# Patient Record
Sex: Male | Born: 1955 | Race: White | Hispanic: No | Marital: Married | Smoking: Never smoker
Health system: Southern US, Community
[De-identification: ages and names within clinical notes are randomized; demographics above are authoritative.]

## PROBLEM LIST (undated history)

## (undated) DIAGNOSIS — I10 Essential (primary) hypertension: Secondary | ICD-10-CM

## (undated) DIAGNOSIS — Q231 Congenital insufficiency of aortic valve: Secondary | ICD-10-CM

## (undated) DIAGNOSIS — Q2381 Bicuspid aortic valve: Secondary | ICD-10-CM

## (undated) DIAGNOSIS — J180 Bronchopneumonia, unspecified organism: Secondary | ICD-10-CM

## (undated) DIAGNOSIS — E785 Hyperlipidemia, unspecified: Secondary | ICD-10-CM

## (undated) DIAGNOSIS — I351 Nonrheumatic aortic (valve) insufficiency: Secondary | ICD-10-CM

## (undated) DIAGNOSIS — D1803 Hemangioma of intra-abdominal structures: Secondary | ICD-10-CM

## (undated) DIAGNOSIS — I2510 Atherosclerotic heart disease of native coronary artery without angina pectoris: Secondary | ICD-10-CM

## (undated) DIAGNOSIS — N281 Cyst of kidney, acquired: Secondary | ICD-10-CM

## (undated) HISTORY — DX: Congenital insufficiency of aortic valve: Q23.1

## (undated) HISTORY — DX: Atherosclerotic heart disease of native coronary artery without angina pectoris: I25.10

## (undated) HISTORY — DX: Bicuspid aortic valve: Q23.81

## (undated) HISTORY — DX: Essential (primary) hypertension: I10

## (undated) HISTORY — DX: Cyst of kidney, acquired: N28.1

## (undated) HISTORY — DX: Nonrheumatic aortic (valve) insufficiency: I35.1

## (undated) HISTORY — DX: Hyperlipidemia, unspecified: E78.5

## (undated) HISTORY — PX: SUPERFICIAL LYMPH NODE BIOPSY / EXCISION: SUR127

## (undated) HISTORY — DX: Hemangioma of intra-abdominal structures: D18.03

## (undated) HISTORY — DX: Bronchopneumonia, unspecified organism: J18.0

---

## 1998-02-04 ENCOUNTER — Encounter: Admission: RE | Admit: 1998-02-04 | Discharge: 1998-02-04 | Payer: Self-pay | Admitting: *Deleted

## 1998-02-17 ENCOUNTER — Encounter: Admission: RE | Admit: 1998-02-17 | Discharge: 1998-02-17 | Payer: Self-pay | Admitting: *Deleted

## 1998-02-24 ENCOUNTER — Encounter: Admission: RE | Admit: 1998-02-24 | Discharge: 1998-02-24 | Payer: Self-pay | Admitting: *Deleted

## 2010-09-29 ENCOUNTER — Encounter: Payer: Self-pay | Admitting: *Deleted

## 2010-09-29 ENCOUNTER — Ambulatory Visit (INDEPENDENT_AMBULATORY_CARE_PROVIDER_SITE_OTHER): Payer: BC Managed Care – PPO | Admitting: Cardiovascular Disease

## 2010-09-29 ENCOUNTER — Encounter: Payer: Self-pay | Admitting: Cardiovascular Disease

## 2010-09-29 DIAGNOSIS — I359 Nonrheumatic aortic valve disorder, unspecified: Secondary | ICD-10-CM

## 2010-09-29 DIAGNOSIS — R011 Cardiac murmur, unspecified: Secondary | ICD-10-CM

## 2010-09-29 DIAGNOSIS — I351 Nonrheumatic aortic (valve) insufficiency: Secondary | ICD-10-CM

## 2010-09-29 DIAGNOSIS — I38 Endocarditis, valve unspecified: Secondary | ICD-10-CM

## 2010-09-29 NOTE — Assessment & Plan Note (Signed)
The patient has a very loud cardiac murmur which seems to be suggestive of an aortic etiology. The patient had no previous reported heart murmur. He is not aware of any previous history of bicuspid aortic valve. This is very concerning for possible subacute endocarditis due to his recent upper respiratory tract infection as well as dental work about 2 months ago. Surprisingly, the patient is really not having any cardiac symptoms. He is not having any signs of infection as well and remains afebrile. I sent the patient to have stat labs done which showed a white cell count of 7000, hemoglobin is 13.8 and a platelet count of 255. His CMP is normal. Sedimentation rate was only 7. I arranged for him to have a stat echocardiogram at [facility]. I reviewed the images. The left ventricle is mildly dilated at 5.5 cm. LV systolic function is borderline reduced with an ejection fraction of 50%. The aortic valve appears to be thickened but not well visualized. A bicuspid aortic valve could not be ruled out. 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 A small vegetation could not be ruled out. Based on these findings, I advised the patient to be admitted to the hospital to obtain blood cultures and start empiric antibiotics. The patient did not want to be admitted as he actually is not having any symptoms and also once to inform his wife. I will arrange for him to have a transesophageal echocardiogram tomorrow at [facility]. If it shows a definitive vegetation, he will need to be admitted to the hospital.

## 2010-09-29 NOTE — Progress Notes (Signed)
HPI  This is a 55 year old male who is referred urgently by Dr. Marina Goodell due to the loud murmur that was detected today. The patient has no previous cardiac history. He has no previous heart murmur or rheumatic fever. He has no chronic medical conditions. There is no history of hypertension, hyperlipidemia or diabetes. He does not have any prior history of endocarditis. This is a healthy gentleman who had a cold about one month ago which was not unusual for him. He did not have any fevers or chills at that time. He did not take any antibiotics and the condition resolved on its own after about a week. 2 weeks ago, he started hearing noise in his chest and hearing the blood flow. He hears gurgling noise. The patient otherwise is feeling very well . He denies any chest pain, dyspnea, orthopnea or PND. There is no lower extremity edema. He is very active and continues to exercise regularly. He bikes and runs on a regular basis. He ran 1and 1/2 miles yesterday without any exertional symptoms. He denies any dizziness, syncope or presyncope. He did have a root canal done 2 months ago but did not have any fevers or chills around that time. He is not aware of any congenital heart disease. 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   No Known Allergies   No current outpatient prescriptions on file prior to visit.     History reviewed. No pertinent past medical history.   Past Surgical History  Procedure Date  . Superficial lymph node biopsy / excision     under arm     Family History  Problem Relation Age of Onset  . Heart disease    . Cancer    . Heart attack    . Arrhythmia    . Hypertension    . Hyperlipidemia       History   Social History  . Marital Status: Married    Spouse Name: N/A    Number of Children: 7  . 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 Smoking status: Never Smoker   . Smokeless  tobacco: Never Used  . Alcohol Use: No  . Drug Use: Yes    Special: Marijuana     last used over 20 years ago  . Sexually Active: Not on file   Other Topics Concern  . Not on file   Social History Narrative  . No narrative on file     ROS Constitutional: Negative for fever, chills, diaphoresis, activity change, appetite change and fatigue.  HENT: Negative for hearing loss, nosebleeds, congestion, sore throat, facial swelling, drooling, trouble swallowing, neck pain, voice change, sinus pressure and tinnitus.  Eyes: Negative for photophobia, pain, discharge and visual disturbance.  Respiratory: Negative for apnea, cough, chest tightness, shortness of breath and wheezing.  Cardiovascular: Negative for chest pain, palpitations and leg swelling.  Gastrointestinal: Negative for nausea, vomiting, abdominal pain, diarrhea, constipation, blood in stool and abdominal distention.  Genitourinary: Negative for dysuria, urgency, frequency, hematuria and decreased urine volume.  Musculoskeletal: Negative for myalgias, back pain, joint swelling, arthralgias and gait problem.  Skin: Negative for color change, pallor, rash and wound.  Neurological: Negative for dizziness, tremors, seizures, syncope, speech difficulty, weakness, light-headedness, numbness and headaches.  Psychiatric/Behavioral: Negative for suicidal ideas, hallucinations, behavioral problems and agitation. The patient is not nervous/anxious.     PHYSICAL EXAM   BP  146/72  Pulse 81  Temp 98.4 F (36.9 C)  Ht 5\' 7"  (1.702 m)  Wt 196 lb (88.905 kg)  BMI 30.70 kg/m2  SpO2 97%  Constitutional: He is oriented to person, place, and time. He appears well-developed and well-nourished. No distress.  HENT: No nasal discharge.  Head: Normocephalic and atraumatic.  Eyes: Pupils are equal, round, and reactive to light. Right eye exhibits no discharge. Left eye exhibits no discharge.  Neck: Normal range of motion. Neck supple. Mild JVD  present. No thyromegaly present.  Cardiovascular: Normal rate, regular rhythm,and intact distal pulses. Exam reveals no gallop and no friction rub.  There is a very loud 5/6 blowing systolic murmur mostly at the aortic and the left sternal border. There is a 1/6 diastolic murmur at the left sternal border. S2 is diminished. Pulmonary/Chest: Effort normal and breath sounds normal. No stridor. No respiratory distress. He has no wheezes. He has no rales. He exhibits no tenderness.  Abdominal: Soft. Bowel sounds are normal. He exhibits no distension. There is no tenderness. There is no rebound and no guarding.  Musculoskeletal: Normal range of motion. He exhibits no edema and no tenderness.  Neurological: He is alert and oriented to person, place, and time. Coordination normal.  Skin: Skin is warm and dry. No rash noted. He is not diaphoretic. No erythema. No pallor.  Psychiatric: He has a normal mood and affect. His behavior is normal. Judgment and thought content normal.       EKG: His ECG was reviewed. It showed normal sinus rhythm with a ventricular rate of 72 beats per minute. Nonspecific ST changes. Normal PR and QT intervals.   ASSESSMENT AND PLAN

## 2010-09-30 ENCOUNTER — Encounter: Payer: Self-pay | Admitting: *Deleted

## 2010-09-30 ENCOUNTER — Ambulatory Visit (HOSPITAL_COMMUNITY)
Admission: RE | Admit: 2010-09-30 | Discharge: 2010-09-30 | Disposition: A | Discharge status: Home / Self Care | Payer: BC Managed Care – PPO | Source: Ambulatory Visit | Attending: Internal Medicine | Admitting: Internal Medicine

## 2010-09-30 DIAGNOSIS — Q231 Congenital insufficiency of aortic valve: Secondary | ICD-10-CM | POA: Insufficient documentation

## 2010-09-30 DIAGNOSIS — I10 Essential (primary) hypertension: Secondary | ICD-10-CM | POA: Insufficient documentation

## 2010-09-30 DIAGNOSIS — I359 Nonrheumatic aortic valve disorder, unspecified: Secondary | ICD-10-CM

## 2010-10-02 HISTORY — PX: AORTIC VALVE SURGERY: SHX549

## 2010-10-02 HISTORY — PX: CARDIAC CATHETERIZATION: SHX172

## 2010-10-03 ENCOUNTER — Ambulatory Visit (INDEPENDENT_AMBULATORY_CARE_PROVIDER_SITE_OTHER): Payer: BC Managed Care – PPO | Admitting: Cardiovascular Disease

## 2010-10-03 ENCOUNTER — Encounter: Payer: Self-pay | Admitting: Cardiovascular Disease

## 2010-10-03 VITALS — BP 144/75 | HR 85 | Ht 67.0 in | Wt 195.0 lb

## 2010-10-03 DIAGNOSIS — I351 Nonrheumatic aortic (valve) insufficiency: Secondary | ICD-10-CM

## 2010-10-03 DIAGNOSIS — I359 Nonrheumatic aortic valve disorder, unspecified: Secondary | ICD-10-CM

## 2010-10-03 NOTE — Assessment & Plan Note (Signed)
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 There was no evidence of aortic aneurysm or ectasia. Surprisingly, the murmur seems to be not as loud as it was last week The  cardiac murmur only became obvious recently which is likely due to the new onset of prolapsing leaflet. Although there is no indication for aortic valve surgery at this time, the future course is somewhat unpredictable in this condition. The other issue is that the patient feels strongly about not taking warfarin in the future due to previous experience with his mother who had multiple complications from anticoagulation after her valve surgery. Due to all of that, I will refer the patient to Dr. Silvestre Mesi at [facility] for a second opinion and to see if the valve is repairable.  In the meanwhile, I asked the patient to resume his regular activities but avoid any weight lifting and intense aerobic exercises. He will require right and left cardiac catheterization before any planned valvular surgery.

## 2010-10-03 NOTE — Progress Notes (Signed)
HPI  This is a 55 year old male who is here today for a followup visit after recent transesophageal echocardiogram. The patient was seen urgently last week on Thursday after detection of a loud heart murmur by Dr. Marina Goodell. The patient had no cardiac symptoms. He did have a recent upper respiratory tract infection about 4 weeks before his presentation. His chief complaint was actually hearing a noise in his chest. The patient is very active physically and exercises on a regular basis. He runs 2 miles a day on most days of the week. 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 He is married and has 7 children. He lives in [place]. During my evaluation last week, I was impressed with the intensity of the murmur which was 5/6 in the aortic and the left sternal border. It had a systolic and diastolic component and was concerning for an aortic etiology. I was especially concerned about possibility of subacute endocarditis and thus I sent him to have stat labs which showed normal white cell count and sedimentation rate. He had an urgent echocardiogram done which showed an ejection fraction of 55%, borderline dilated left ventricle with a left ventricular end-diastolic dimension of 55 cm. There was possible bicuspid aortic valve with moderate to severe aortic insufficiency. The valve was not well visualized and a vegetation could not be excluded. Based on that, I advised him to get admitted to the hospital but he declined because he was feeling well. Thus, I have arranged for him to have a transesophageal echocardiogram on Friday which confirmed the presence of bicuspid aortic valve, no evidence of vegetation as well as a prolapse of the right coronary cusp with a very eccentric aortic insufficiency towards the mitral valve leaflet. The severity of regurgitation was at least moderate but likely underestimated due to to the eccentric jet. The ejection fraction was normal. The patient's overall is feeling  reasonably well at this time. He is definitely stressed about all this new findings. 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   No Known Allergies   No current outpatient prescriptions on file prior to visit.     Past Medical History  Diagnosis Date  . Aortic insufficiency     moderate to severe  . Bicuspid aortic valve      Past Surgical History  Procedure Date  . Superficial lymph node biopsy / excision     under arm     Family History  Problem Relation Age of Onset  . Heart disease    . Cancer    . Heart attack    . Arrhythmia    . Hypertension    . Hyperlipidemia       History   Social History  . Marital Status: Married    Spouse Name: N/A    Number of Children: 7  . 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 Smoking status: Never Smoker   . Smokeless tobacco: Never Used  . Alcohol Use: No  . Drug Use: Yes    Special: Marijuana     last used over 20 years ago  . Sexually Active: Not on file   Other Topics Concern  . Not on file   Social History Narrative  . No narrative on file      PHYSICAL EXAM  BP 144/75  Pulse 85  Ht 5\' 7"  (1.702 m)  Wt 195 lb (88.451 kg)  BMI 30.54 kg/m2  SpO2 97%  Constitutional: He is oriented to person, place, and time. He appears well-developed and well-nourished. No distress.  HENT: No nasal discharge.  Head: Normocephalic and atraumatic.  Eyes: Pupils are equal, round, and reactive to light. Right eye exhibits no discharge. Left eye exhibits no discharge.  Neck: Normal range of motion. Neck supple. No JVD present. No thyromegaly present.  Cardiovascular: Normal rate, regular rhythm, normal heart sounds and intact distal pulses. Exam reveals no gallop and no friction rub.  There is a 1/6 systolic ejection murmur at the aortic area as well as 3/6 decrescendo diastolic  murmur at the left sternal border. Pulmonary/Chest: Effort normal and breath sounds normal. No stridor. No respiratory distress. He has no wheezes. He has no rales. He exhibits no tenderness.  Abdominal: Soft. Bowel sounds are normal. He exhibits no distension. There is no tenderness. There is no rebound and no guarding.  Musculoskeletal: Normal range of motion. He exhibits no edema and no tenderness.  Neurological: He is alert and oriented to person, place, and time. Coordination normal.  Skin: Skin is warm and dry. No rash noted. He is not diaphoretic. No erythema. No pallor.  Psychiatric: He has a normal mood and affect. His behavior is normal. Judgment and thought content normal.        ASSESSMENT AND PLAN

## 2010-11-28 ENCOUNTER — Encounter: Payer: Self-pay | Admitting: Cardiovascular Disease

## 2010-11-28 ENCOUNTER — Ambulatory Visit (INDEPENDENT_AMBULATORY_CARE_PROVIDER_SITE_OTHER): Payer: BC Managed Care – PPO | Admitting: Cardiovascular Disease

## 2010-11-28 VITALS — BP 118/77 | HR 73 | Ht 67.0 in | Wt 190.0 lb

## 2010-11-28 DIAGNOSIS — E785 Hyperlipidemia, unspecified: Secondary | ICD-10-CM

## 2010-11-28 DIAGNOSIS — I359 Nonrheumatic aortic valve disorder, unspecified: Secondary | ICD-10-CM

## 2010-11-28 DIAGNOSIS — I351 Nonrheumatic aortic (valve) insufficiency: Secondary | ICD-10-CM

## 2010-11-28 MED ORDER — ATORVASTATIN CALCIUM 20 MG PO TABS: 20.0000 mg | ORAL_TABLET | Freq: Every day | ORAL | Status: DC

## 2010-11-28 MED ORDER — METOPROLOL TARTRATE 25 MG PO TABS: 25.0000 mg | ORAL_TABLET | Freq: Two times a day (BID) | ORAL | Status: DC

## 2010-11-28 NOTE — Assessment & Plan Note (Signed)
He is status post aortic valve repair about 5 weeks ago. He is doing very well. He does not have a diastolic murmur anymore. Even the intensity of the systolic murmur has decreased significantly. I will keep him on aspirin 81 mg once daily. Obviously, he still has a bicuspid aortic valve that will need to be monitored. I recommend a followup baseline echocardiogram in 4 months from now. He will likely need a yearly echocardiogram after that. I also informed him that all his children will need to be examined for heart murmurs and that she should notify their pediatricians of his current diagnosis.

## 2010-11-28 NOTE — Progress Notes (Signed)
HPI  This is a 55 year old male who is here today for followup visit. He has a history of moderate to severe aortic insufficiency in the setting of bicuspid aortic valve. 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 I suggested pursuing the possibility of aortic valve repair instead of replacement. The patient requested to be seen at [facility]. Thus, I referred him to Dr. Silvestre Mesi. The patient was seen there and was scheduled for an outpatient surgery to be done this month. However, he had an episode of dyspnea and dizziness. He was seen at [facility] and then transferred to [facility]. The patient did not have myocardial infarction and was not in heart failure. It was felt that there was some anxiety component. The patient was discharged home and scheduled for aortic valve repair few days after discharge. He underwent cardiac catheterization which showed only a 30% stenosis in the right coronary artery. The patient underwent aortic valve repair without complications. Overall, he feels reasonably well. He denies any chest pain or dyspnea. There still some discomfort at the incision. He is going back to work in a week.  No Known Allergies   No current outpatient prescriptions on file prior to visit.     Past Medical History  Diagnosis Date  . Aortic insufficiency     moderate to severe  . Bicuspid aortic valve   . Coronary artery disease     30% in RCA  . Hyperlipidemia      Past Surgical History  Procedure Date  . Superficial lymph node biopsy / excision     under arm  . Aortic valve surgery 10/2010    valve repair without replacement at [facility].   . Cardiac catheterization 10/2010    30% in RCA     Family History  Problem Relation Age of Onset  . Heart disease    . Cancer    . Heart attack    . Arrhythmia    . Hypertension    . Hyperlipidemia       History   Social History  . Marital Status: Married    Spouse Name: N/A   Number of Children: 7  . 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 Smoking status: Never Smoker   . Smokeless tobacco: Never Used  . Alcohol Use: No  . Drug Use: Yes    Special: Marijuana     last used over 20 years ago  . Sexually Active: Not on file   Other Topics Concern  . Not on file   Social History Narrative  . No narrative on file      PHYSICAL EXAM   BP 118/77  Pulse 73  Ht 5\' 7"  (1.702 m)  Wt 190 lb (86.183 kg)  BMI 29.76 kg/m2  SpO2 97%  Constitutional: He is oriented to person, place, and time. He appears well-developed and well-nourished. No distress.  HENT: No nasal discharge.  Head: Normocephalic and atraumatic.  Eyes: Pupils are equal, round, and reactive to light. Right eye exhibits no discharge. Left eye exhibits no discharge.  Neck: Normal range of motion. Neck supple. No JVD present. No thyromegaly present.  Cardiovascular: Normal rate, regular rhythm, normal heart sounds and intact distal pulses. Exam reveals no gallop and no friction rub.  There is a 1/6 systolic ejection murmur at the aortic area. Pulmonary/Chest: Effort normal and breath sounds normal.  No stridor. No respiratory distress. He has no wheezes. He has no rales. He exhibits no tenderness.  Abdominal: Soft. Bowel sounds are normal. He exhibits no distension. There is no tenderness. There is no rebound and no guarding.  Musculoskeletal: Normal range of motion. He exhibits no edema and no tenderness.  Neurological: He is alert and oriented to person, place, and time. Coordination normal.  Skin: Skin is warm and dry. No rash noted. He is not diaphoretic. No erythema. No pallor.  Psychiatric: He has a normal mood and affect. His behavior is normal. Judgment and thought content normal.        ASSESSMENT AND PLAN

## 2010-11-28 NOTE — Assessment & Plan Note (Signed)
The patient was recently started on atorvastatin 20 mg daily for hyperlipidemia as well as nonobstructive coronary artery disease. I will request a fasting lipid and liver profile. I also talked with him about resuming his aerobic exercises.

## 2010-11-28 NOTE — Patient Instructions (Signed)
Your physician recommends that you schedule a follow-up appointment in: 4 months in [facility]  Your physician recommends that you return for lab work in: this week  .

## 2011-04-10 ENCOUNTER — Telehealth: Payer: Self-pay | Admitting: Cardiovascular Disease

## 2011-04-10 DIAGNOSIS — I359 Nonrheumatic aortic valve disorder, unspecified: Secondary | ICD-10-CM

## 2011-04-10 MED ORDER — AMOXICILLIN 500 MG PO CAPS: ORAL_CAPSULE | ORAL | Status: DC

## 2011-04-10 NOTE — Telephone Encounter (Signed)
Spoke with pt. He had aortic valve repair in July 2012. I told him he would need amoxicillin 2gms by mouth thirty minutes prior to dental work. He has no medication allergies. States he has latex allergy. Will send prescription to [facility] [place].

## 2011-04-10 NOTE — Telephone Encounter (Signed)
Pt having root canal on 04-18-11, does he need abx? If so uses Chief Technology Officer

## 2011-04-21 ENCOUNTER — Encounter: Payer: Self-pay | Admitting: Internal Medicine

## 2011-04-24 ENCOUNTER — Encounter: Payer: Self-pay | Admitting: Cardiovascular Disease

## 2011-04-24 ENCOUNTER — Ambulatory Visit (INDEPENDENT_AMBULATORY_CARE_PROVIDER_SITE_OTHER): Payer: BC Managed Care – PPO | Admitting: Cardiovascular Disease

## 2011-04-24 VITALS — BP 118/76 | HR 64 | Ht 67.0 in | Wt 208.4 lb

## 2011-04-24 DIAGNOSIS — I359 Nonrheumatic aortic valve disorder, unspecified: Secondary | ICD-10-CM

## 2011-04-24 DIAGNOSIS — I351 Nonrheumatic aortic (valve) insufficiency: Secondary | ICD-10-CM

## 2011-04-24 NOTE — Assessment & Plan Note (Signed)
S/p repair only of bicuspid aortic valve for severe AI at [facility]. Doing well. He will need repeat echo to evaluate his bicuspid valve. There is a mild systolic murmur. Will need yearly echos.

## 2011-04-24 NOTE — Patient Instructions (Signed)
Your physician wants you to follow-up in: 12 months.  You will receive a reminder letter in the mail two months in advance. If you don't receive a letter, please call our office to schedule the follow-up appointment.  Your physician has requested that you have an echocardiogram. Echocardiography is a painless test that uses sound waves to create images of your heart. It provides your doctor with information about the size and shape of your heart and how well your heart's chambers and valves are working. This procedure takes approximately one hour. There are no restrictions for this procedure.    Your physician recommends that you continue on your current medications as directed. Please refer to the Current Medication list given to you today.   

## 2011-04-24 NOTE — Progress Notes (Signed)
History of Present Illness: 56 yo WM with history of bicuspid aortic valve s/p aortic valve repair at [facility] 2012. He has been followed by Dr. Kirke Corin in the [facility] office.  He has a history of moderate to severe aortic insufficiency in the setting of bicuspid aortic valve. 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 Dr. Kirke Corin suggested pursuing the possibility of aortic valve repair instead of replacement. The patient requested to be seen at [facility].  He underwent cardiac catheterization which showed only a 30% stenosis in the right coronary artery. The patient underwent aortic valve repair without complications on 09/24/10.   Overall, he feels reasonably well. He denies any chest pain or dyspnea. He does feel fatigued at times. He has been working. 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   Past Medical History  Diagnosis Date  . Aortic insufficiency     moderate to severe  . Bicuspid aortic valve   . Coronary artery disease     30% in RCA  . Hyperlipidemia     Past Surgical History  Procedure Date  . Superficial lymph node biopsy / excision     under arm  . Aortic valve surgery 10/2010    valve repair without replacement at [facility].   . Cardiac catheterization 10/2010    30% in RCA    Current Outpatient Prescriptions  Medication Sig Dispense Refill  . amoxicillin (AMOXIL) 500 MG capsule Take 4 capsules by mouth 30 minutes prior to dental work  4 capsule  0  . aspirin 81 MG tablet Take 81 mg by mouth daily.        Marland Kitchen atorvastatin (LIPITOR) 20 MG tablet Take 1 tablet (20 mg total) by mouth daily.  90 tablet  2  . DHA-EPA-Coenzyme Q10-Vitamin E (CO Q-10 VITAMIN E FISH OIL PO) Take by mouth.      . metoprolol tartrate (LOPRESSOR) 25 MG tablet Take 1 tablet (25 mg total) by mouth 2 (two) times daily.  180 tablet  2  . Multiple Vitamin (MULTIVITAMIN) tablet Take 1 tablet by mouth daily.      . vitamin B-12 (CYANOCOBALAMIN) 100 MCG tablet  Take 250 mcg by mouth daily.        Allergies  Allergen Reactions  . Latex     History   Social History  . Marital Status: Married    Spouse Name: N/A    Number of Children: 7  . 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 Smoking status: Never Smoker   . Smokeless tobacco: Never Used  . Alcohol Use: No  . Drug Use: Yes    Special: Marijuana     last used over 20 years ago  . Sexually Active: Not on file   Other Topics Concern  . Not on file   Social History Narrative  . No narrative on file    Family History  Problem Relation Age of Onset  . Heart disease    . Cancer    . Heart attack    . Arrhythmia    . Hypertension    . Hyperlipidemia      Review of Systems:  As stated in the HPI and otherwise negative.   BP 118/76  Pulse 64  Ht 5\' 7"  (1.702 m)  Wt 208 lb 6.4 oz (94.53 kg)  BMI 32.64 kg/m2  Physical Examination: General:  Well developed, well nourished, NAD HEENT: OP clear, mucus membranes moist SKIN: warm, dry. No rashes. Neuro: No focal deficits Musculoskeletal: Muscle strength 5/5 all ext Psychiatric: Mood and affect normal Neck: No JVD, no carotid bruits, no thyromegaly, no lymphadenopathy. Lungs:Clear bilaterally, no wheezes, rhonci, crackles Cardiovascular: Regular rate and rhythm. Slight systolic  Murmur. No  gallops or rubs. Abdomen:Soft. Bowel sounds present. Non-tender.  Extremities: No lower extremity edema. Pulses are 2 + in the bilateral DP/PT.

## 2011-05-01 ENCOUNTER — Ambulatory Visit (HOSPITAL_COMMUNITY): Payer: BC Managed Care – PPO | Attending: Cardiology | Admitting: Radiology

## 2011-05-01 DIAGNOSIS — I2510 Atherosclerotic heart disease of native coronary artery without angina pectoris: Secondary | ICD-10-CM | POA: Insufficient documentation

## 2011-05-01 DIAGNOSIS — E785 Hyperlipidemia, unspecified: Secondary | ICD-10-CM | POA: Insufficient documentation

## 2011-05-01 DIAGNOSIS — Q231 Congenital insufficiency of aortic valve: Secondary | ICD-10-CM | POA: Insufficient documentation

## 2011-05-01 DIAGNOSIS — I351 Nonrheumatic aortic (valve) insufficiency: Secondary | ICD-10-CM

## 2011-05-01 DIAGNOSIS — I359 Nonrheumatic aortic valve disorder, unspecified: Secondary | ICD-10-CM

## 2011-08-25 ENCOUNTER — Telehealth: Payer: Self-pay | Admitting: Cardiovascular Disease

## 2011-08-25 MED ORDER — METOPROLOL TARTRATE 25 MG PO TABS: 25.0000 mg | ORAL_TABLET | Freq: Two times a day (BID) | ORAL | Status: DC

## 2011-08-25 NOTE — Telephone Encounter (Signed)
Spoke to patient's wife stated patient needs refill for metoprolol sent to [facility] in [place].

## 2011-08-25 NOTE — Telephone Encounter (Signed)
metprolol 25 mg , [facility] [place] , said pharmacy requested twice no response, pt now out, pls call

## 2011-09-14 ENCOUNTER — Other Ambulatory Visit: Payer: Self-pay | Admitting: Cardiovascular Disease

## 2011-09-14 ENCOUNTER — Other Ambulatory Visit (HOSPITAL_COMMUNITY): Payer: Self-pay | Admitting: *Deleted

## 2011-09-14 MED ORDER — ATORVASTATIN CALCIUM 20 MG PO TABS: 20.0000 mg | ORAL_TABLET | Freq: Every day | ORAL | Status: DC

## 2011-09-14 NOTE — Telephone Encounter (Signed)
Refilled atorvastatin

## 2012-05-07 ENCOUNTER — Ambulatory Visit: Payer: BC Managed Care – PPO | Admitting: Cardiovascular Disease

## 2012-05-08 ENCOUNTER — Encounter: Payer: Self-pay | Admitting: Cardiovascular Disease

## 2012-05-08 ENCOUNTER — Ambulatory Visit (INDEPENDENT_AMBULATORY_CARE_PROVIDER_SITE_OTHER): Payer: BC Managed Care – PPO | Admitting: Cardiovascular Disease

## 2012-05-08 VITALS — BP 150/84 | HR 83 | Ht 67.0 in | Wt 214.0 lb

## 2012-05-08 DIAGNOSIS — R002 Palpitations: Secondary | ICD-10-CM

## 2012-05-08 DIAGNOSIS — I2510 Atherosclerotic heart disease of native coronary artery without angina pectoris: Secondary | ICD-10-CM

## 2012-05-08 DIAGNOSIS — Q231 Congenital insufficiency of aortic valve: Secondary | ICD-10-CM

## 2012-05-08 DIAGNOSIS — Q2381 Bicuspid aortic valve: Secondary | ICD-10-CM

## 2012-05-08 LAB — BASIC METABOLIC PANEL
Chloride: 104 mEq/L (ref 96–112)
Creatinine, Ser: 0.9 mg/dL (ref 0.4–1.5)
GFR: 94.23 mL/min (ref >60.00)
Potassium: 3.5 mEq/L (ref 3.5–5.1)
Sodium: 139 mEq/L (ref 135–145)

## 2012-05-08 MED ORDER — FUROSEMIDE 20 MG PO TABS: 20.0000 mg | ORAL_TABLET | Freq: Every day | ORAL | Status: DC

## 2012-05-08 NOTE — Patient Instructions (Addendum)
Your physician recommends that you schedule a follow-up appointment in: 2-3 weeks.   Your physician has requested that you have an echocardiogram. Echocardiography is a painless test that uses sound waves to create images of your heart. It provides your doctor with information about the size and shape of your heart and how well your heart's chambers and valves are working. This procedure takes approximately one hour. There are no restrictions for this procedure.  Your physician has recommended that you wear a holter monitor. Holter monitors are medical devices that record the heart's electrical activity. Doctors most often use these monitors to diagnose arrhythmias. Arrhythmias are problems with the speed or rhythm of the heartbeat. The monitor is a small, portable device. You can wear one while you do your normal daily activities. This is usually used to diagnose what is causing palpitations/syncope (passing out).  Your physician has requested that you have an exercise stress myoview. For further information please visit [email or web address] Please follow instruction sheet, as given.  Your physician has recommended you make the following change in your medication:  Start furosemide 20 mg by mouth daily

## 2012-05-08 NOTE — Progress Notes (Signed)
History of Present Illness: 57 yo WM with history of bicuspid aortic valve s/p aortic valve repair at [facility] 2012.  He has a history of moderate to severe aortic insufficiency in the setting of bicuspid aortic valve. 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 Dr. Kirke Corin suggested pursuing the possibility of aortic valve repair instead of replacement. The patient requested to be seen at [facility]. He underwent cardiac catheterization which showed only a 30% stenosis in the right coronary artery. The patient underwent aortic valve repair without complications on 09/24/10.   He is here today for follow up. He tells me that he has been having constant chest pressure for the last 3-4 months.No worsening with exertion but this is a tightness. He feels that he can't catch his breath and has constant congestion. He feels terrible.  He also notes palpitations occurring at night.  No prolonged episodes.   Primary Care Physician: Brent Bulla   Past Medical History  Diagnosis Date  . Aortic insufficiency     moderate to severe  . Bicuspid aortic valve   . Coronary artery disease     30% in RCA  . Hyperlipidemia     Past Surgical History  Procedure Date  . Superficial lymph node biopsy / excision     under arm  . Aortic valve surgery 10/2010    valve repair without replacement at [facility].   . Cardiac catheterization 10/2010    30% in RCA    Current Outpatient Prescriptions  Medication Sig Dispense Refill  . amoxicillin (AMOXIL) 500 MG capsule Take 4 capsules by mouth 30 minutes prior to dental work  4 capsule  0  . aspirin 81 MG tablet Take 81 mg by mouth daily.        Marland Kitchen atorvastatin (LIPITOR) 20 MG tablet Take 1 tablet (20 mg total) by mouth daily.  90 tablet  3  . DHA-EPA-Coenzyme Q10-Vitamin E (CO Q-10 VITAMIN E FISH OIL PO) Take by mouth.      . metoprolol tartrate (LOPRESSOR) 25 MG tablet Take 1 tablet (25 mg total) by mouth 2 (two) times daily.  180  tablet  3  . Multiple Vitamin (MULTIVITAMIN) tablet Take 1 tablet by mouth daily.      . vitamin B-12 (CYANOCOBALAMIN) 100 MCG tablet Take 250 mcg by mouth daily.        Allergies  Allergen Reactions  . Latex     History   Social History  . Marital Status: Married    Spouse Name: N/A    Number of Children: 7  . 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 Smoking status: Never Smoker   . Smokeless tobacco: Never Used  . Alcohol Use: No  . Drug Use: Yes    Special: Marijuana     Comment: last used over 20 years ago  . Sexually Active: Not on file   Other Topics Concern  . Not on file   Social History Narrative  . No narrative on file    Family History  Problem Relation Age of Onset  . Hypertension    . Hyperlipidemia    . Heart attack Mother   . Cancer Father     Brain tumor  . Cancer Mother     Breast cancer    Review of Systems:  As stated in the HPI and otherwise negative.  BP 150/84  Pulse 83  Ht 5\' 7"  (1.702 m)  Wt 214 lb (97.07 kg)  BMI 33.52 kg/m2  Physical Examination: General: Well developed, well nourished, NAD HEENT: OP clear, mucus membranes moist SKIN: warm, dry. No rashes. Neuro: No focal deficits Musculoskeletal: Muscle strength 5/5 all ext Psychiatric: Mood and affect normal Neck: + JVD, no carotid bruits, no thyromegaly, no lymphadenopathy. Lungs:Clear bilaterally, no wheezes, rhonci, crackles Cardiovascular: Regular rate and rhythm. Systolic murmur, diastolic murmur. +S3.  Abdomen:Soft. Bowel sounds present. Non-tender.  Extremities: No lower extremity edema. Pulses are 2 + in the bilateral DP/PT.  EKG: NSR, rate 77 bpm. Non-specific ST and T wave abnormality.   Echo 05/01/11: Left ventricle: The cavity size was normal. Wall thickness was normal. The estimated ejection fraction was 60%. Wall motion was normal; there were no regional wall motion abnormalities. - Aortic  valve: By history, this is a bicuspid valve. Surgery was done to repair prolapse of one cusp of the valve. Currently there is some doming of the valve. There is mid AS and very mild AI. Mean gradient: 16mm Hg (S). Peak gradient: 27mm Hg (S). - Aorta: The ascending aorta is not dilated.  Assessment and Plan:   1. Aortic insufficiency: S/p repair only of bicuspid aortic valve for severe AI at [facility] in 2012. He is not doing well. He will need repeat echo to evaluate his bicuspid valve as both his diastolic and systolic murmurs are louder. He may have mild heart failure.   2. Chest pain/CAD: Will arrange exercise stress myoview to exclude ischemia  3. Palpitations: Will arrange 48 hour monitor to exclude arrythmias  4. Volume overload: +JVD, S3. Will add Lasix 20 mg po once daily. Check BMET.

## 2012-05-14 ENCOUNTER — Ambulatory Visit (HOSPITAL_COMMUNITY): Payer: BC Managed Care – PPO | Attending: Cardiology | Admitting: Radiology

## 2012-05-14 VITALS — BP 136/84 | HR 72 | Ht 67.0 in | Wt 208.0 lb

## 2012-05-14 DIAGNOSIS — R0789 Other chest pain: Secondary | ICD-10-CM | POA: Insufficient documentation

## 2012-05-14 DIAGNOSIS — R0989 Other specified symptoms and signs involving the circulatory and respiratory systems: Secondary | ICD-10-CM | POA: Insufficient documentation

## 2012-05-14 DIAGNOSIS — R0609 Other forms of dyspnea: Secondary | ICD-10-CM | POA: Insufficient documentation

## 2012-05-14 DIAGNOSIS — R0602 Shortness of breath: Secondary | ICD-10-CM

## 2012-05-14 DIAGNOSIS — I2510 Atherosclerotic heart disease of native coronary artery without angina pectoris: Secondary | ICD-10-CM | POA: Insufficient documentation

## 2012-05-14 DIAGNOSIS — R002 Palpitations: Secondary | ICD-10-CM | POA: Insufficient documentation

## 2012-05-14 DIAGNOSIS — R079 Chest pain, unspecified: Secondary | ICD-10-CM

## 2012-05-14 DIAGNOSIS — R42 Dizziness and giddiness: Secondary | ICD-10-CM | POA: Insufficient documentation

## 2012-05-14 DIAGNOSIS — I4949 Other premature depolarization: Secondary | ICD-10-CM

## 2012-05-14 DIAGNOSIS — R5383 Other fatigue: Secondary | ICD-10-CM | POA: Insufficient documentation

## 2012-05-14 DIAGNOSIS — R5381 Other malaise: Secondary | ICD-10-CM | POA: Insufficient documentation

## 2012-05-14 MED ORDER — TECHNETIUM TC 99M SESTAMIBI GENERIC - CARDIOLITE
10.0000 | Freq: Once | INTRAVENOUS | Status: AC | PRN
  Administered 2012-05-14: 10 via INTRAVENOUS

## 2012-05-14 MED ORDER — TECHNETIUM TC 99M SESTAMIBI GENERIC - CARDIOLITE
30.0000 | Freq: Once | INTRAVENOUS | Status: AC | PRN
  Administered 2012-05-14: 30 via INTRAVENOUS

## 2012-05-14 NOTE — Progress Notes (Signed)
[facility] 3 NUCLEAR MED [address] [place], [place] [address] [phone]    Cardiology Nuclear Med Study  Gillermo Poch is a 57 y.o. male     MRN : [id number]     DOB: [birth year]  Procedure Date: 05/14/2012  Nuclear Med Background Indication for Stress Test:  Evaluation for Ischemia History:  6/12 Cath:n/o CAD>Bicuspid AVR; 1/13 Echo:EF=60% Cardiac Risk Factors: Family History - CAD, Lipids and Obesity  Symptoms:  Chest Pressure, "constantly".  (chest discomfort now is 1/10), Dizziness, DOE/SOB, Fatigue and Palpitations    Nuclear Pre-Procedure Caffeine/Decaff Intake:  None > 12 hrs NPO After: 10:30pm   Lungs:  Clear. O2 Sat: 98% on room air. IV 0.9% NS with Angio Cath:  22g  IV Site: R Antecubital x 1, tolerated well IV Started by:  Irean Hong, RN  Chest Size (in):  46 Cup Size: n/a  Height: 5\' 7"  (1.702 m)  Weight:  208 lb (94.348 kg)  BMI:  Body mass index is 32.57 kg/(m^2). Tech Comments:  Lopressor held x 16 hours    Nuclear Med Study 1 or 2 day study: 1 day  Stress Test Type:  Stress  Reading MD: Cassell Clement, MD  Order Authorizing Provider:  Verne Carrow, MD  Resting Radionuclide: Technetium 40m Sestamibi  Resting Radionuclide Dose: 11.0 mCi   Stress Radionuclide:  Technetium 82m Sestamibi  Stress Radionuclide Dose: 33.0 mCi           Stress Protocol Rest HR: 72 Stress HR: 169  Rest BP: 136/84 Stress BP: 190/81  Exercise Time (min): 8:30 METS: 10.4   Predicted Max HR: 165 bpm % Max HR: 102.42 bpm Rate Pressure Product: 21308   Dose of Adenosine (mg):  n/a Dose of Lexiscan: n/a mg  Dose of Atropine (mg): n/a Dose of Dobutamine: n/a mcg/kg/min (at max HR)  Stress Test Technologist: Smiley Houseman, CMA-N  Nuclear Technologist:  Domenic Polite, CNMT     Rest Procedure:  Myocardial perfusion imaging was performed at rest 45 minutes following the intravenous administration of Technetium 38m Sestamibi.  Rest ECG: NSR with  non-specific ST-T wave changes  Stress Procedure:  The patient exercised on the treadmill utilizing the Bruce Protocol for 8:30 minutes. The patient stopped due to fatigue and denied any chest pain.  Technetium 32m Sestamibi was injected at peak exercise and myocardial perfusion imaging was performed after a brief delay.  Stress ECG: No significant ST segment change suggestive of ischemia.  QPS Raw Data Images:  Normal; no motion artifact; normal heart/lung ratio. Stress Images:  Normal homogeneous uptake in all areas of the myocardium. Rest Images:  Normal homogeneous uptake in all areas of the myocardium. Subtraction (SDS):  No evidence of ischemia. Transient Ischemic Dilatation (Normal <1.22):  0.91 Lung/Heart Ratio (Normal <0.45):  0.30  Quantitative Gated Spect Images QGS EDV:  110 ml QGS ESV:  50 ml  Impression Exercise Capacity:  Good exercise capacity. BP Response:  Normal blood pressure response. Clinical Symptoms:  No chest pain. ECG Impression:  No significant ST segment change suggestive of ischemia. Comparison with Prior Nuclear Study: No previous nuclear study performed  Overall Impression:  Normal stress nuclear study.  LV Ejection Fraction: 54%.  LV Wall Motion:  NL LV Function; NL Wall Motion   [employer]

## 2012-05-17 ENCOUNTER — Other Ambulatory Visit (HOSPITAL_COMMUNITY): Payer: BC Managed Care – PPO

## 2012-05-23 ENCOUNTER — Ambulatory Visit (HOSPITAL_COMMUNITY): Payer: BC Managed Care – PPO | Attending: Cardiovascular Disease

## 2012-05-23 ENCOUNTER — Ambulatory Visit (INDEPENDENT_AMBULATORY_CARE_PROVIDER_SITE_OTHER): Payer: BC Managed Care – PPO

## 2012-05-23 DIAGNOSIS — I2510 Atherosclerotic heart disease of native coronary artery without angina pectoris: Secondary | ICD-10-CM | POA: Insufficient documentation

## 2012-05-23 DIAGNOSIS — R002 Palpitations: Secondary | ICD-10-CM

## 2012-05-23 DIAGNOSIS — I359 Nonrheumatic aortic valve disorder, unspecified: Secondary | ICD-10-CM | POA: Insufficient documentation

## 2012-05-23 DIAGNOSIS — E785 Hyperlipidemia, unspecified: Secondary | ICD-10-CM | POA: Insufficient documentation

## 2012-05-23 DIAGNOSIS — Q231 Congenital insufficiency of aortic valve: Secondary | ICD-10-CM | POA: Insufficient documentation

## 2012-05-23 DIAGNOSIS — R079 Chest pain, unspecified: Secondary | ICD-10-CM | POA: Insufficient documentation

## 2012-05-23 NOTE — Progress Notes (Signed)
Echocardiogram performed.  

## 2012-05-24 NOTE — Progress Notes (Signed)
Placed a 48 hr monitor and went over instructions on how to use it and when to return it

## 2012-05-29 ENCOUNTER — Encounter: Payer: Self-pay | Admitting: Cardiovascular Disease

## 2012-05-29 ENCOUNTER — Ambulatory Visit (HOSPITAL_COMMUNITY)
Admission: RE | Admit: 2012-05-29 | Discharge: 2012-05-29 | Disposition: A | Discharge status: Home / Self Care | Payer: BC Managed Care – PPO | Source: Ambulatory Visit | Attending: Cardiovascular Disease | Admitting: Cardiovascular Disease

## 2012-05-29 ENCOUNTER — Ambulatory Visit (INDEPENDENT_AMBULATORY_CARE_PROVIDER_SITE_OTHER): Payer: BC Managed Care – PPO | Admitting: Cardiovascular Disease

## 2012-05-29 VITALS — BP 134/70 | HR 70 | Ht 67.0 in | Wt 213.0 lb

## 2012-05-29 DIAGNOSIS — R059 Cough, unspecified: Secondary | ICD-10-CM | POA: Insufficient documentation

## 2012-05-29 DIAGNOSIS — I2510 Atherosclerotic heart disease of native coronary artery without angina pectoris: Secondary | ICD-10-CM

## 2012-05-29 DIAGNOSIS — R002 Palpitations: Secondary | ICD-10-CM

## 2012-05-29 DIAGNOSIS — I359 Nonrheumatic aortic valve disorder, unspecified: Secondary | ICD-10-CM

## 2012-05-29 DIAGNOSIS — R0602 Shortness of breath: Secondary | ICD-10-CM

## 2012-05-29 NOTE — Progress Notes (Signed)
History of Present Illness: 57 yo WM with history of bicuspid aortic valve s/p aortic valve repair at [facility] 2012. He has a history of moderate to severe aortic insufficiency in the setting of bicuspid aortic valve. 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 Dr. Kirke Corin suggested pursuing the possibility of aortic valve repair instead of replacement. The patient requested to be seen at [facility]. He underwent cardiac catheterization which showed only a 30% stenosis in the right coronary artery. The patient underwent aortic valve repair without complications on 09/24/10. I saw him in the office 05/08/12 and he had c/o constant chest pressure for 3-4 months. No worsening with exertion but this is a tightness. He feels that he can't catch his breath and has constant congestion. He feels terrible. He also notes palpitations occurring at night. No prolonged episodes. I arranged an echo, 48 hour holter monitor and exercise stress myoview. Echo showed LVH with normal LV function, mild AI and mild AS but overall well functioning aortic valve s/p repair. Exercise stress myoview on 05/14/12 with good exercise tolerance, no chest pain and no evidence of ischemia with normal LV function noted. 48 hour holter monitor with PACs, NSR.   He is here today for follow up. He continues to have palpitations, cough and SOB. No fever or chills. No chest pains. No near syncope or syncope. He tells me today that he had a positive PPD 21 years ago but normal CXR one year ago per primary care. No lower ext edema.   Primary Care Physician: Brent Bulla   Past Medical History  Diagnosis Date  . Aortic insufficiency     moderate to severe  . Bicuspid aortic valve   . Coronary artery disease     30% in RCA  . Hyperlipidemia     Past Surgical History  Procedure Laterality Date  . Superficial lymph node biopsy / excision      under arm  . Aortic valve surgery  10/2010    valve repair  without replacement at [facility].   . Cardiac catheterization  10/2010    30% in RCA    Current Outpatient Prescriptions  Medication Sig Dispense Refill  . amoxicillin (AMOXIL) 500 MG capsule Take 4 capsules by mouth 30 minutes prior to dental work  4 capsule  0  . aspirin 81 MG tablet Take 81 mg by mouth daily.        Marland Kitchen atorvastatin (LIPITOR) 20 MG tablet Take 1 tablet (20 mg total) by mouth daily.  90 tablet  3  . DHA-EPA-Coenzyme Q10-Vitamin E (CO Q-10 VITAMIN E FISH OIL PO) Take by mouth.      . furosemide (LASIX) 20 MG tablet Take 1 tablet (20 mg total) by mouth daily.  30 tablet  6  . metoprolol tartrate (LOPRESSOR) 25 MG tablet Take 1 tablet (25 mg total) by mouth 2 (two) times daily.  180 tablet  3  . Multiple Vitamin (MULTIVITAMIN) tablet Take 1 tablet by mouth daily.      . vitamin B-12 (CYANOCOBALAMIN) 100 MCG tablet Take 250 mcg by mouth daily.       No current facility-administered medications for this visit.    Allergies  Allergen Reactions  . Latex     History   Social History  . Marital Status: Married    Spouse Name: N/A    Number of Children: 7  . Years of Education: N/A   Occupational History  . 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 Smoking status: Never Smoker   . Smokeless tobacco: Never Used  . Alcohol Use: No  . Drug Use: Yes    Special: Marijuana     Comment: last used over 20 years ago  . Sexually Active: Not on file   Other Topics Concern  . Not on file   Social History Narrative  . No narrative on file    Family History  Problem Relation Age of Onset  . Hypertension    . Hyperlipidemia    . Heart attack Mother   . Cancer Father     Brain tumor  . Cancer Mother     Breast cancer    Review of Systems:  As stated in the HPI and otherwise negative.   BP 134/70  Pulse 70  Ht 5\' 7"  (1.702 m)  Wt 213 lb (96.616 kg)  BMI 33.35 kg/m2  Physical Examination: General: Well developed, well nourished, NAD HEENT: OP clear,  mucus membranes moist SKIN: warm, dry. No rashes. Neuro: No focal deficits Musculoskeletal: Muscle strength 5/5 all ext Psychiatric: Mood and affect normal Neck: No JVD, no carotid bruits, no thyromegaly, no lymphadenopathy. Lungs:Clear bilaterally, no wheezes, rhonci, crackles Cardiovascular: Regular rate and rhythm. No murmurs, gallops or rubs. Abdomen:Soft. Bowel sounds present. Non-tender.  Extremities: No lower extremity edema. Pulses are 2 + in the bilateral DP/PT.  Echo 05/23/12: Left ventricle: Septal hypokinesis The cavity size was normal. Wall thickness was increased in a pattern of moderate LVH. There was focal basal hypertrophy. Systolic function was normal. The estimated ejection fraction was in the range of 55% to 60%. Wall motion was normal; there were no regional wall motion abnormalities. - Aortic valve: Apparently has had AV surgery Valve does not appear to be bicuspid on short axis images. Mild residual AS and AR - Atrial septum: No defect or patent foramen ovale was Identified.  Stress myoview 05/14/12: Stress Procedure: The patient exercised on the treadmill utilizing the Bruce Protocol for 8:30 minutes. The patient stopped due to fatigue and denied any chest pain. Technetium 47m Sestamibi was injected at peak exercise and myocardial perfusion imaging was performed after a brief delay.  Stress ECG: No significant ST segment change suggestive of ischemia.  QPS  Raw Data Images: Normal; no motion artifact; normal heart/lung ratio.  Stress Images: Normal homogeneous uptake in all areas of the myocardium.  Rest Images: Normal homogeneous uptake in all areas of the myocardium.  Subtraction (SDS): No evidence of ischemia.  Transient Ischemic Dilatation (Normal <1.22): 0.91  Lung/Heart Ratio (Normal <0.45): 0.30  Quantitative Gated Spect Images  QGS EDV: 110 ml  QGS ESV: 50 ml  Impression  Exercise Capacity: Good exercise capacity.  BP Response: Normal blood  pressure response.  Clinical Symptoms: No chest pain.  ECG Impression: No significant ST segment change suggestive of ischemia.  Comparison with Prior Nuclear Study: No previous nuclear study performed  Overall Impression: Normal stress nuclear study.  LV Ejection Fraction: 54%. LV Wall Motion: NL LV Function; NL Wall Motion  Assessment and Plan:   1. Aortic valve disease: S/p repair only of bicuspid aortic valve for severe AI at [facility] in 2012. Recent echo 05/23/12 with mild aortic valvular disease (mild AI, mild AS).    2. Chest pain/CAD: He is known to have mild CAD by cath 2012. Stress test without ischemia. No further ischemic workup at this time.   3. Palpitations: PACs on initial Holter strips. Will get  full report then discuss with patient. Consider 21 day monitor if symptoms persist.   4. Acute on chronic diastolic CHF: Volume status is better with low dose Lasix. Will continue Lasix.   5. Dyspnea/Cough: Persistent. No fevers. Will check CXR today.

## 2012-05-29 NOTE — Patient Instructions (Addendum)
Your physician wants you to follow-up in: 3 months--scheduled for Aug 29, 2012 at 4:15  Have chest X-ray done at [facility].  [place] [address]  Go to radiology to have X-ray

## 2012-08-20 ENCOUNTER — Other Ambulatory Visit: Payer: Self-pay | Admitting: Cardiovascular Disease

## 2012-08-29 ENCOUNTER — Ambulatory Visit: Payer: BC Managed Care – PPO | Admitting: Cardiovascular Disease

## 2012-08-30 ENCOUNTER — Other Ambulatory Visit: Payer: Self-pay | Admitting: *Deleted

## 2012-08-30 MED ORDER — METOPROLOL TARTRATE 25 MG PO TABS: ORAL_TABLET | ORAL | Status: DC

## 2012-09-02 ENCOUNTER — Other Ambulatory Visit: Payer: Self-pay | Admitting: *Deleted

## 2012-09-02 MED ORDER — METOPROLOL TARTRATE 25 MG PO TABS: ORAL_TABLET | ORAL | Status: DC

## 2012-09-18 ENCOUNTER — Telehealth: Payer: Self-pay

## 2012-09-19 ENCOUNTER — Other Ambulatory Visit: Payer: Self-pay

## 2012-09-19 MED ORDER — ATORVASTATIN CALCIUM 20 MG PO TABS: 20.0000 mg | ORAL_TABLET | Freq: Every day | ORAL | Status: DC

## 2012-09-19 NOTE — Telephone Encounter (Signed)
Per Shelda Jakes RN will send in refill of medication and put reminder on refill for pt to make a follow up appointment.

## 2013-03-17 ENCOUNTER — Other Ambulatory Visit: Payer: Self-pay | Admitting: Cardiovascular Disease

## 2013-08-26 ENCOUNTER — Other Ambulatory Visit: Payer: Self-pay | Admitting: Cardiovascular Disease

## 2013-09-29 ENCOUNTER — Other Ambulatory Visit: Payer: Self-pay | Admitting: Cardiovascular Disease

## 2013-10-22 ENCOUNTER — Encounter: Payer: Self-pay | Admitting: Cardiovascular Disease

## 2013-10-22 ENCOUNTER — Ambulatory Visit (INDEPENDENT_AMBULATORY_CARE_PROVIDER_SITE_OTHER): Payer: BC Managed Care – PPO | Admitting: Cardiovascular Disease

## 2013-10-22 VITALS — BP 140/90 | HR 76 | Ht 67.0 in | Wt 196.0 lb

## 2013-10-22 DIAGNOSIS — I359 Nonrheumatic aortic valve disorder, unspecified: Secondary | ICD-10-CM

## 2013-10-22 DIAGNOSIS — R002 Palpitations: Secondary | ICD-10-CM

## 2013-10-22 DIAGNOSIS — I2510 Atherosclerotic heart disease of native coronary artery without angina pectoris: Secondary | ICD-10-CM

## 2013-10-22 MED ORDER — METOPROLOL TARTRATE 25 MG PO TABS: ORAL_TABLET | ORAL | Status: DC

## 2013-10-22 NOTE — Progress Notes (Signed)
History of Present Illness: 58 yo WM with history of bicuspid aortic valve s/p aortic valve repair at [facility] 2012 who is here today for follow up. He had a history of moderate to severe aortic insufficiency in the setting of bicuspid aortic valve. 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 Dr. Fletcher Anon suggested pursuing the possibility of aortic valve repair instead of replacement. The patient requested to be seen at [facility]. He underwent cardiac catheterization 2012 which showed only a 30% stenosis in the right coronary artery. The patient underwent aortic valve repair without complications on 3/50/09. I saw him in the office 05/08/12 and he had c/o constant chest pressure for 3-4 months and palpitations at night. I arranged an echo, 48 hour holter monitor and exercise stress myoview. Echo showed LVH with normal LV function, mild AI and mild AS but overall well functioning aortic valve s/p repair. Exercise stress myoview on 05/14/12 with good exercise tolerance, no chest pain and no evidence of ischemia with normal LV function noted. 48 hour holter monitor with PACs, NSR. He has not tolerated statins.   He is here today for follow up. He continues to have daily chest heaviness, central and the same as it has been every day over last three years since his CABG. No near syncope or syncope. Dyspnea is improved. No lower ext edema.   Primary Care Physician: Reinaldo Meeker  Past Medical History  Diagnosis Date  . Aortic insufficiency     moderate to severe  . Bicuspid aortic valve   . Coronary artery disease     30% in RCA  . Hyperlipidemia     Past Surgical History  Procedure Laterality Date  . Superficial lymph node biopsy / excision      under arm  . Aortic valve surgery  10/2010    valve repair without replacement at [facility].   . Cardiac catheterization  10/2010    30% in RCA    Current Outpatient Prescriptions  Medication Sig Dispense Refill  .  aspirin 81 MG tablet Take 81 mg by mouth daily.        . DHA-EPA-Coenzyme Q10-Vitamin E (CO Q-10 VITAMIN E FISH OIL PO) Take by mouth.      . metoprolol tartrate (LOPRESSOR) 25 MG tablet TAKE ONE TABLET BY MOUTH TWICE DAILY  180 tablet  3  . Multiple Vitamin (MULTIVITAMIN) tablet Take 1 tablet by mouth daily.      . vitamin B-12 (CYANOCOBALAMIN) 100 MCG tablet Take 250 mcg by mouth daily.      Marland Kitchen amoxicillin (AMOXIL) 500 MG capsule Take 4 capsules by mouth 30 minutes prior to dental work  4 capsule  0   No current facility-administered medications for this visit.    Allergies  Allergen Reactions  . Latex     History   Social History  . Marital Status: Married    Spouse Name: N/A    Number of Children: 7  . 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 Smoking status: Never Smoker   . Smokeless tobacco: Never Used  . Alcohol Use: No  . Drug Use: Yes    Special: Marijuana     Comment: last used over 20 years ago  . Sexual Activity: Not on file   Other Topics Concern  . Not on file   Social History Narrative  . No narrative  on file    Family History  Problem Relation Age of Onset  . Hypertension    . Hyperlipidemia    . Heart attack Mother   . Cancer Father     Brain tumor  . Cancer Mother     Breast cancer    Review of Systems:  As stated in the HPI and otherwise negative.   BP 140/90  Pulse 76  Ht 5\' 7"  (1.702 m)  Wt 196 lb (88.905 kg)  BMI 30.69 kg/m2  Physical Examination: General: Well developed, well nourished, NAD HEENT: OP clear, mucus membranes moist SKIN: warm, dry. No rashes. Neuro: No focal deficits Musculoskeletal: Muscle strength 5/5 all ext Psychiatric: Mood and affect normal Neck: No JVD, no carotid bruits, no thyromegaly, no lymphadenopathy. Lungs:Clear bilaterally, no wheezes, rhonci, crackles Cardiovascular: Regular rate and rhythm. No murmurs, gallops or rubs. Abdomen:Soft.  Bowel sounds present. Non-tender.  Extremities: No lower extremity edema. Pulses are 2 + in the bilateral DP/PT.  EKG: NSR, rate 76 bpm. Non-specific ST abnormality. (unchanged from February 2015)  Echo 05/23/12: Left ventricle: Septal hypokinesis The cavity size was normal. Wall thickness was increased in a pattern of moderate LVH. There was focal basal hypertrophy. Systolic function was normal. The estimated ejection fraction was in the range of 55% to 60%. Wall motion was normal; there were no regional wall motion abnormalities. - Aortic valve: Apparently has had AV surgery Valve does not appear to be bicuspid on short axis images. Mild residual AS and AR - Atrial septum: No defect or patent foramen ovale was Identified.  Stress myoview 05/14/12: Stress Procedure: The patient exercised on the treadmill utilizing the Bruce Protocol for 8:30 minutes. The patient stopped due to fatigue and denied any chest pain. Technetium 69m Sestamibi was injected at peak exercise and myocardial perfusion imaging was performed after a brief delay.  Stress ECG: No significant ST segment change suggestive of ischemia.  QPS  Raw Data Images: Normal; no motion artifact; normal heart/lung ratio.  Stress Images: Normal homogeneous uptake in all areas of the myocardium.  Rest Images: Normal homogeneous uptake in all areas of the myocardium.  Subtraction (SDS): No evidence of ischemia.  Transient Ischemic Dilatation (Normal <1.22): 0.91  Lung/Heart Ratio (Normal <0.45): 0.30  Quantitative Gated Spect Images  QGS EDV: 110 ml  QGS ESV: 50 ml  Impression  Exercise Capacity: Good exercise capacity.  BP Response: Normal blood pressure response.  Clinical Symptoms: No chest pain.  ECG Impression: No significant ST segment change suggestive of ischemia.  Comparison with Prior Nuclear Study: No previous nuclear study performed  Overall Impression: Normal stress nuclear study.  LV Ejection Fraction: 54%. LV Wall  Motion: NL LV Function; NL Wall Motion  Assessment and Plan:   1. Aortic valve disease: S/p repair only of bicuspid aortic valve for severe AI at [facility] in 2012. Echo 05/23/12 with mild aortic valvular disease (mild AI, mild AS).    2. CAD: He is known to have mild CAD by cath 2012. Stress test February 2015 without ischemia. No further ischemic workup at this time.   3. Palpitations: Likely due to PACs.  Continue beta blocker.

## 2013-10-22 NOTE — Patient Instructions (Signed)
Your physician wants you to follow-up in:  12 months.  You will receive a reminder letter in the mail two months in advance. If you don't receive a letter, please call our office to schedule the follow-up appointment.   

## 2014-01-21 IMAGING — CR DG CHEST 2V
2 series · 2 of 2 positions shown · non-contrast
Comparison: 10/16/2010

CLINICAL DATA: Short of breath.  Cough.

CHEST - 2 VIEW

[w chest pa]
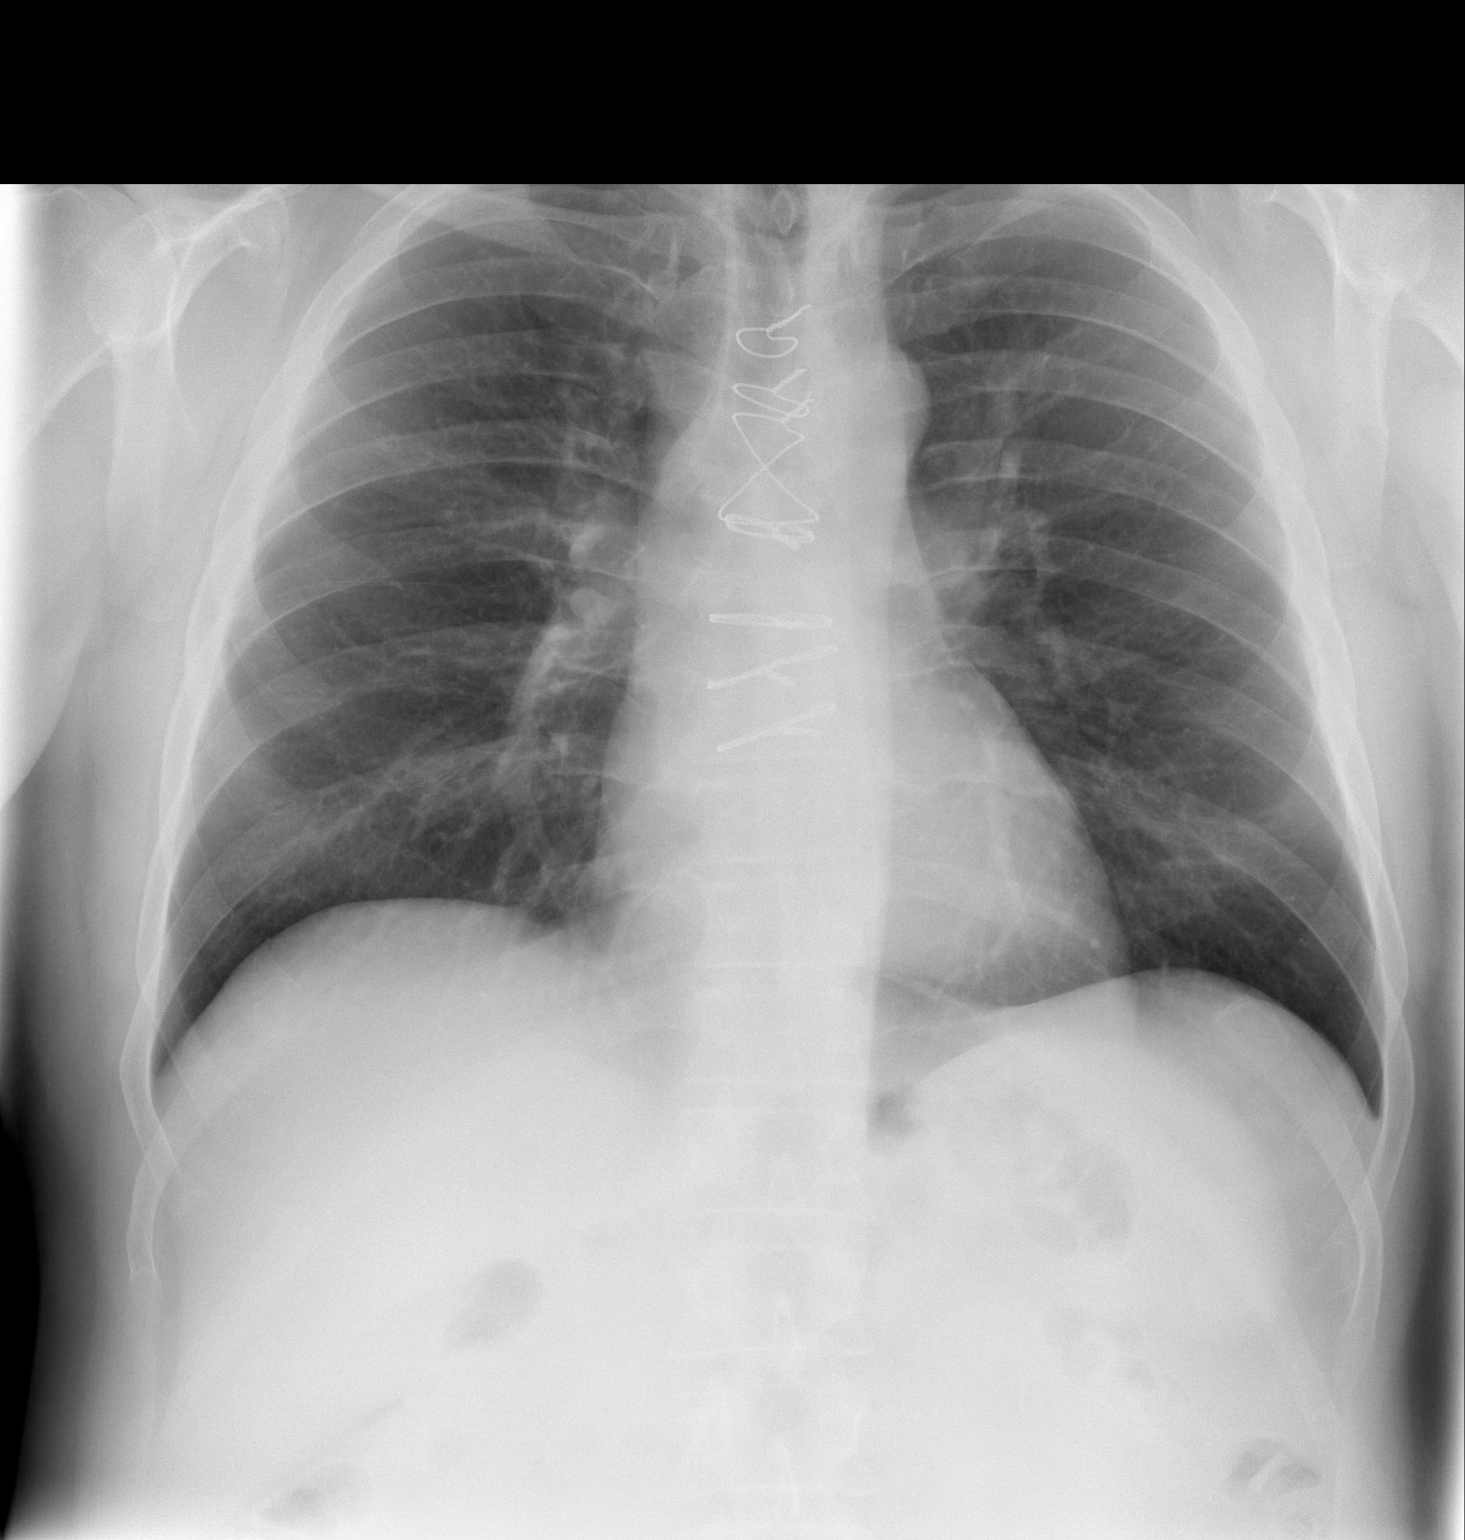

[w chest lat]
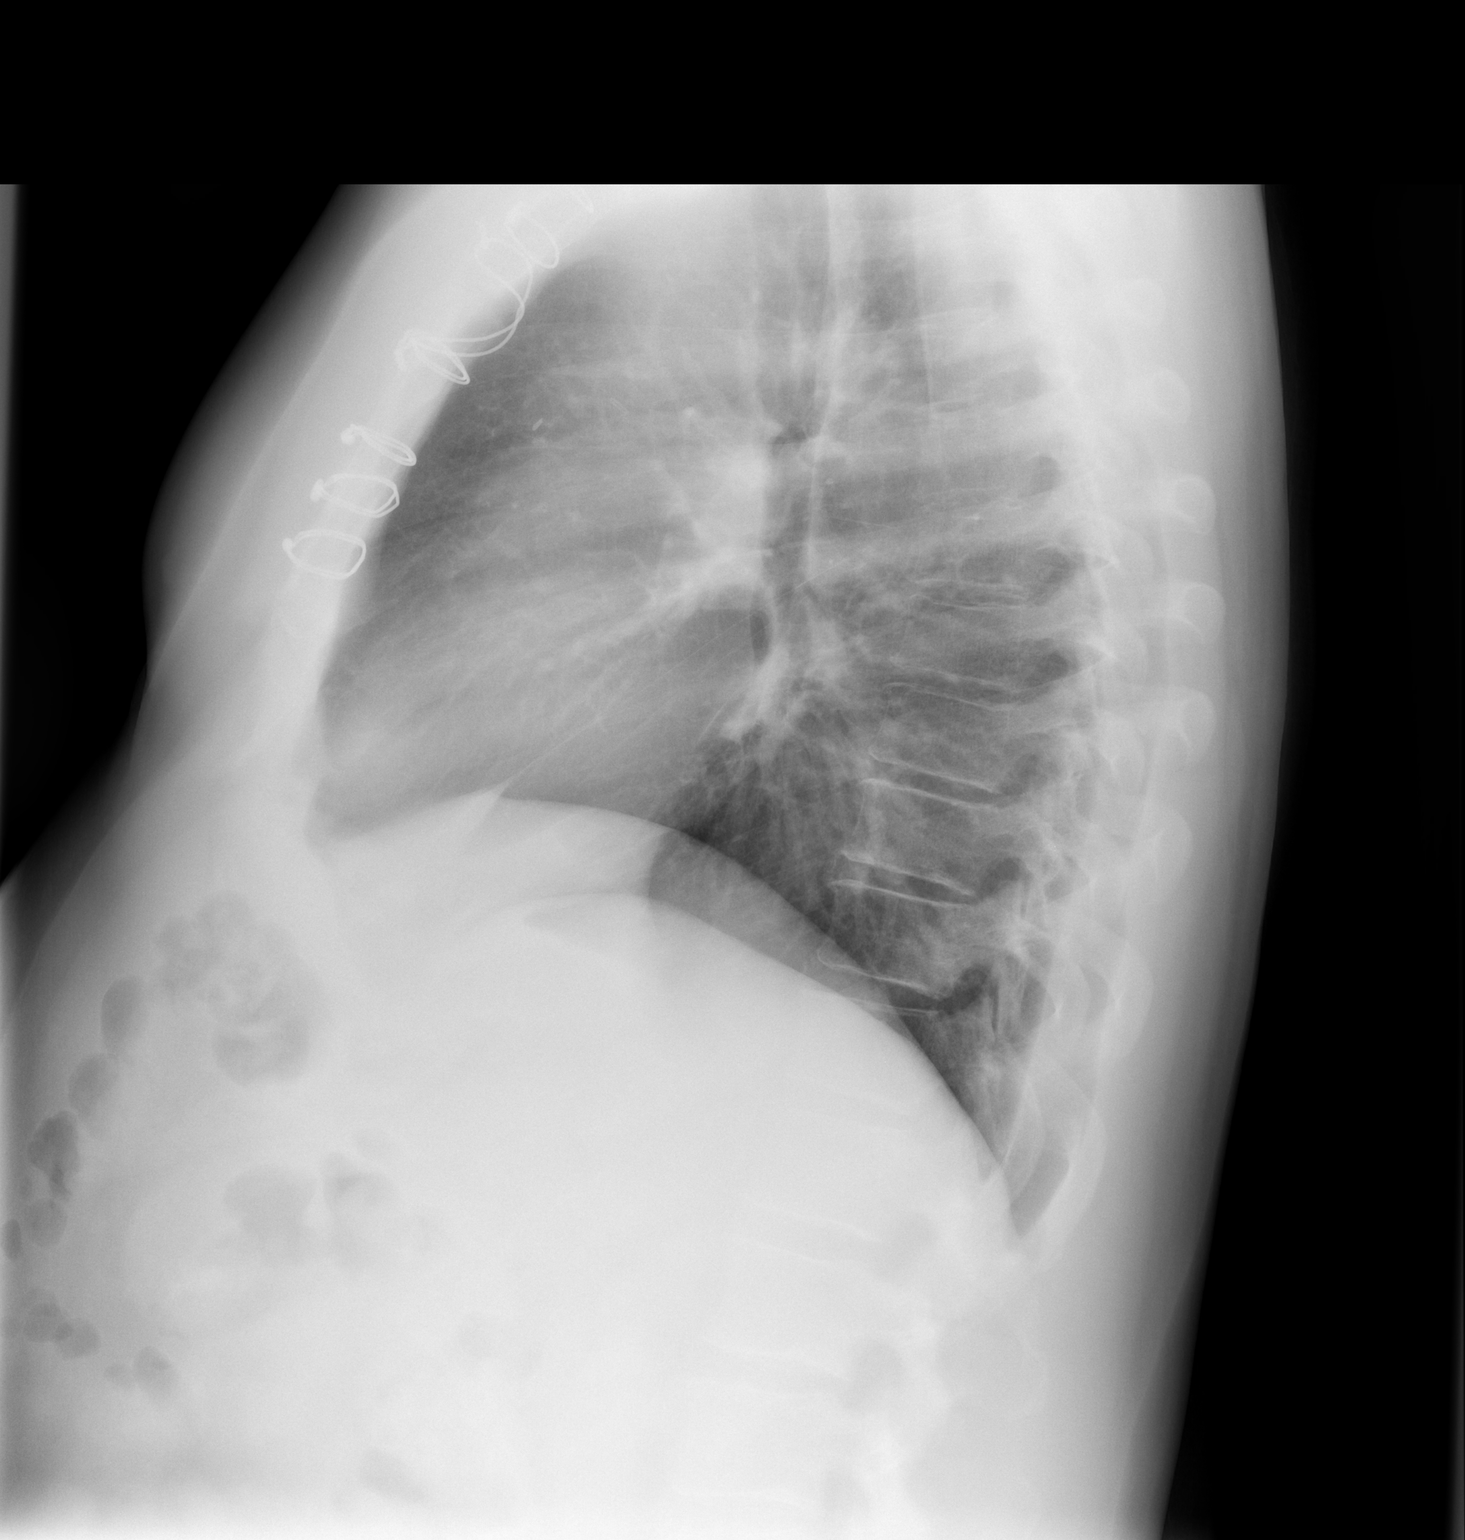

[2 of 2 positions shown; findings below may reference images not displayed]

FINDINGS: Previous median sternotomy CABG procedure.  Heart size
appears normal.  There is no pleural effusion or edema.  No
airspace consolidation is noted.  Review of the visualized osseous
structures is unremarkable.
IMPRESSION: 1.  No acute cardiopulmonary abnormalities.

## 2014-03-18 ENCOUNTER — Encounter: Payer: Self-pay | Admitting: Internal Medicine

## 2014-05-14 ENCOUNTER — Ambulatory Visit (AMBULATORY_SURGERY_CENTER): Payer: Self-pay

## 2014-05-14 VITALS — Ht 66.75 in | Wt 195.0 lb

## 2014-05-14 DIAGNOSIS — Z800 Family history of malignant neoplasm of digestive organs: Secondary | ICD-10-CM

## 2014-05-14 MED ORDER — MOVIPREP 100 G PO SOLR: 1.0000 | Freq: Once | ORAL | Status: DC

## 2014-05-14 NOTE — Progress Notes (Signed)
No allergies to eggs or soy No diet/weight loss meds nohome oxygen No past problems with anessthesia  Has email  Emmi instructions given for colonoscopy

## 2014-05-21 ENCOUNTER — Encounter: Payer: Self-pay | Admitting: Internal Medicine

## 2014-05-28 ENCOUNTER — Encounter: Payer: Self-pay | Admitting: Internal Medicine

## 2014-05-28 ENCOUNTER — Ambulatory Visit (AMBULATORY_SURGERY_CENTER): Payer: BC Managed Care – PPO | Admitting: Internal Medicine

## 2014-05-28 VITALS — BP 114/73 | HR 70 | Temp 95.3°F | Resp 16 | Ht 66.75 in | Wt 195.0 lb

## 2014-05-28 DIAGNOSIS — Z1211 Encounter for screening for malignant neoplasm of colon: Secondary | ICD-10-CM

## 2014-05-28 MED ORDER — SODIUM CHLORIDE 0.9 % IV SOLN: 500.0000 mL | INTRAVENOUS | Status: DC

## 2014-05-28 NOTE — Progress Notes (Signed)
Report to PACU, RN, vss, BBS= Clear.  

## 2014-05-28 NOTE — Patient Instructions (Signed)
YOU HAD AN ENDOSCOPIC PROCEDURE TODAY AT THE [facility]: Refer to the procedure report that was given to you for any specific questions about what was found during the examination.  If the procedure report does not answer your questions, please call your gastroenterologist to clarify.  If you requested that your care partner not be given the details of your procedure findings, then the procedure report has been included in a sealed envelope for you to review at your convenience later.  YOU SHOULD EXPECT: Some feelings of bloating in the abdomen. Passage of more gas than usual.  Walking can help get rid of the air that was put into your GI tract during the procedure and reduce the bloating. If you had a lower endoscopy (such as a colonoscopy or flexible sigmoidoscopy) you may notice spotting of blood in your stool or on the toilet paper. If you underwent a bowel prep for your procedure, then you may not have a normal bowel movement for a few days.  DIET: Your first meal following the procedure should be a light meal and then it is ok to progress to your normal diet.  A half-sandwich or bowl of soup is an example of a good first meal.  Heavy or fried foods are harder to digest and may make you feel nauseous or bloated.  Likewise meals heavy in dairy and vegetables can cause extra gas to form and this can also increase the bloating.  Drink plenty of fluids but you should avoid alcoholic beverages for 24 hours.  ACTIVITY: Your care partner should take you home directly after the procedure.  You should plan to take it easy, moving slowly for the rest of the day.  You can resume normal activity the day after the procedure however you should NOT DRIVE or use heavy machinery for 24 hours (because of the sedation medicines used during the test).    SYMPTOMS TO REPORT IMMEDIATELY: A gastroenterologist can be reached at any hour.  During normal business hours, 8:30 AM to 5:00 PM Monday through Friday,  call [phone].  After hours and on weekends, please call the GI answering service at [phone] who will take a message and have the physician on call contact you.   Following lower endoscopy (colonoscopy or flexible sigmoidoscopy):  Excessive amounts of blood in the stool  Significant tenderness or worsening of abdominal pains  Swelling of the abdomen that is new, acute  Fever of 100F or higher    FOLLOW UP: If any biopsies were taken you will be contacted by phone or by letter within the next 1-3 weeks.  Call your gastroenterologist if you have not heard about the biopsies in 3 weeks.  Our staff will call the home number listed on your records the next business day following your procedure to check on you and address any questions or concerns that you may have at that time regarding the information given to you following your procedure. This is a courtesy call and so if there is no answer at the home number and we have not heard from you through the emergency physician on call, we will assume that you have returned to your regular daily activities without incident.  SIGNATURES/CONFIDENTIALITY: You and/or your care partner have signed paperwork which will be entered into your electronic medical record.  These signatures attest to the fact that that the information above on your After Visit Summary has been reviewed and is understood.  Full responsibility of the confidentiality   of this discharge information lies with you and/or your care-partner.     

## 2014-05-28 NOTE — Op Note (Signed)
[place]  [address]. [place], [address]   COLONOSCOPY PROCEDURE REPORT  PATIENT: Jorge, Graves  MR#: [id number] BIRTHDATE: 1955/09/10 , 23  yrs. old GENDER: male ENDOSCOPIST: Jerene Bears, MD REFERRED RX:[id number] Henrene Pastor, M.D. PROCEDURE DATE:  05/28/2014 PROCEDURE:   Colonoscopy, screening First Screening Colonoscopy - Avg.  risk and is 50 yrs.  old or older Yes.  Prior Negative Screening - Now for repeat screening. N/A  History of Adenoma - Now for follow-up colonoscopy & has been > or = to 3 yrs.  N/A  Polyps Removed Today? No.  Polyps Removed Today? No.  Recommend repeat exam, <10 yrs? Polyps Removed Today? No.  Recommend repeat exam, <10 yrs? No. ASA CLASS:   Class II INDICATIONS:average risk patient for colon cancer and 1st colonoscopy. MEDICATIONS: Monitored anesthesia care and Propofol 200 mg IV  DESCRIPTION OF PROCEDURE:   After the risks benefits and alternatives of the procedure were thoroughly explained, informed consent was obtained.  The digital rectal exam revealed no abnormalities of the rectum.   The [id number]  endoscope was introduced through the anus and advanced to the cecum, which was identified by both the appendix and ileocecal valve. No adverse events experienced.   The quality of the prep was good, using MoviPrep  The instrument was then slowly withdrawn as the colon was fully examined.      COLON FINDINGS: A normal appearing cecum, ileocecal valve, and appendiceal orifice were identified.  the ascending, transverse, descending, sigmoid colon, and rectum appeared unremarkable. Retroflexed views revealed no abnormalities. The time to cecum=1 minutes 49 seconds.  Withdrawal time=7 minutes 29 seconds.  The scope was withdrawn and the procedure completed.  COMPLICATIONS: There were no complications.  ENDOSCOPIC IMPRESSION: Normal colonoscopy  RECOMMENDATIONS: You should continue to follow colorectal cancer screening  guidelines for "routine risk" patients with a repeat colonoscopy in 10 years. There is no need for FOBT (stool) testing for at least 5 years.  eSigned:  Jerene Bears, MD 05/28/2014 8:54 AM   cc: Reinaldo Meeker, MD and The Patient

## 2014-05-29 ENCOUNTER — Telehealth: Payer: Self-pay | Admitting: *Deleted

## 2014-05-29 NOTE — Telephone Encounter (Signed)
  Follow up Call-  Call back number 05/28/2014  Post procedure Call Back phone  # [phone]  Permission to leave phone message Yes     Patient questions:  Do you have a fever, pain , or abdominal swelling? No. Pain Score  0 *  Have you tolerated food without any problems? Yes.    Have you been able to return to your normal activities? Yes.    Do you have any questions about your discharge instructions: Diet   No. Medications  No. Follow up visit  No.  Do you have questions or concerns about your Care? No.  Actions: * If pain score is 4 or above: No action needed, pain <4.

## 2014-10-13 ENCOUNTER — Other Ambulatory Visit: Payer: Self-pay

## 2014-10-13 MED ORDER — METOPROLOL TARTRATE 25 MG PO TABS: ORAL_TABLET | ORAL | Status: DC

## 2014-11-23 ENCOUNTER — Encounter: Payer: Self-pay | Admitting: Cardiovascular Disease

## 2014-11-23 ENCOUNTER — Ambulatory Visit (INDEPENDENT_AMBULATORY_CARE_PROVIDER_SITE_OTHER): Payer: BC Managed Care – PPO | Admitting: Cardiovascular Disease

## 2014-11-23 VITALS — BP 122/82 | HR 60 | Ht 66.0 in | Wt 200.8 lb

## 2014-11-23 DIAGNOSIS — R002 Palpitations: Secondary | ICD-10-CM

## 2014-11-23 DIAGNOSIS — I359 Nonrheumatic aortic valve disorder, unspecified: Secondary | ICD-10-CM | POA: Diagnosis not present

## 2014-11-23 DIAGNOSIS — I2510 Atherosclerotic heart disease of native coronary artery without angina pectoris: Secondary | ICD-10-CM | POA: Diagnosis not present

## 2014-11-23 NOTE — Progress Notes (Signed)
Chief Complaint  Patient presents with  . Follow-up     History of Present Illness: 59 yo WM with history of bicuspid aortic valve s/p aortic valve repair at [facility] 2012 who is here today for follow up. He had a history of moderate to severe aortic insufficiency in the setting of bicuspid aortic valve. 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 Dr. Fletcher Anon suggested pursuing the possibility of aortic valve repair instead of replacement. The patient requested to be seen at [facility]. He underwent cardiac catheterization 2012 which showed only a 30% stenosis in the right coronary artery. The patient underwent aortic valve repair without complications on 09/02/07. I saw him in the office 05/08/12 and he had c/o constant chest pressure for 3-4 months and palpitations at night. I arranged an echo, 48 hour holter monitor and exercise stress myoview. Echo showed LVH with normal LV function, mild AI and mild AS but overall well functioning aortic valve s/p repair. Exercise stress myoview on 05/14/12 with good exercise tolerance, no chest pain and no evidence of ischemia with normal LV function noted. 48 hour holter monitor with PACs, NSR. He has not tolerated statins.   He is here today for follow up. He has no chest pain or SOB. He is very active. No near syncope or syncope. Dyspnea is improved. No lower ext edema.   Primary Care Physician: Reinaldo Meeker  Past Medical History  Diagnosis Date  . Aortic insufficiency     moderate to severe  . Bicuspid aortic valve   . Coronary artery disease     30% in RCA  . Hyperlipidemia     Past Surgical History  Procedure Laterality Date  . Superficial lymph node biopsy / excision      under arm  . Aortic valve surgery  10/2010    valve repair without replacement at [facility].   . Cardiac catheterization  10/2010    30% in RCA    Current Outpatient Prescriptions  Medication Sig Dispense Refill  . aspirin 81 MG tablet  Take 81 mg by mouth daily.      . DHA-EPA-Coenzyme Q10-Vitamin E (CO Q-10 VITAMIN E FISH OIL PO) Take by mouth.    . metoprolol tartrate (LOPRESSOR) 25 MG tablet TAKE ONE TABLET BY MOUTH TWICE DAILY 60 tablet 1  . Multiple Vitamin (MULTIVITAMIN) tablet Take 1 tablet by mouth daily.    . vitamin B-12 (CYANOCOBALAMIN) 100 MCG tablet Take 250 mcg by mouth daily. As needed/occasional     No current facility-administered medications for this visit.    Allergies  Allergen Reactions  . Latex Rash    Social History   Social History  . Marital Status: Married    Spouse Name: N/A  . Number of Children: 7  . 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 Smoking status: Never Smoker   . Smokeless tobacco: Never Used  . Alcohol Use: No  . Drug Use: Yes    Special: Marijuana     Comment: last used over 20 years ago  . Sexual Activity: Not on file   Other Topics Concern  . Not on file   Social History Narrative    Family History  Problem Relation Age of Onset  . Hypertension    . Hyperlipidemia    . Heart attack Mother   . Cancer Mother     Breast  cancer  . Cancer Father     Brain tumor  . Colon cancer Maternal Grandfather     Review of Systems:  As stated in the HPI and otherwise negative.   BP 122/82 mmHg  Pulse 60  Ht 5\' 6"  (1.676 m)  Wt 200 lb 12.8 oz (91.082 kg)  BMI 32.43 kg/m2  SpO2 98%  Physical Examination: General: Well developed, well nourished, NAD HEENT: OP clear, mucus membranes moist SKIN: warm, dry. No rashes. Neuro: No focal deficits Musculoskeletal: Muscle strength 5/5 all ext Psychiatric: Mood and affect normal Neck: No JVD, no carotid bruits, no thyromegaly, no lymphadenopathy. Lungs:Clear bilaterally, no wheezes, rhonci, crackles Cardiovascular: Regular rate and rhythm. Soft systolic murmur. No gallops or rubs. Abdomen:Soft. Bowel sounds present. Non-tender.  Extremities: No lower  extremity edema. Pulses are 2 + in the bilateral DP/PT.  Echo 05/23/12: Left ventricle: Septal hypokinesis The cavity size was normal. Wall thickness was increased in a pattern of moderate LVH. There was focal basal hypertrophy. Systolic function was normal. The estimated ejection fraction was in the range of 55% to 60%. Wall motion was normal; there were no regional wall motion abnormalities. - Aortic valve: Apparently has had AV surgery Valve does not appear to be bicuspid on short axis images. Mild residual AS and AR - Atrial septum: No defect or patent foramen ovale was Identified.  Stress myoview 05/14/12: Stress Procedure: The patient exercised on the treadmill utilizing the Bruce Protocol for 8:30 minutes. The patient stopped due to fatigue and denied any chest pain. Technetium 22m Sestamibi was injected at peak exercise and myocardial perfusion imaging was performed after a brief delay.  Stress ECG: No significant ST segment change suggestive of ischemia.  QPS  Raw Data Images: Normal; no motion artifact; normal heart/lung ratio.  Stress Images: Normal homogeneous uptake in all areas of the myocardium.  Rest Images: Normal homogeneous uptake in all areas of the myocardium.  Subtraction (SDS): No evidence of ischemia.  Transient Ischemic Dilatation (Normal <1.22): 0.91  Lung/Heart Ratio (Normal <0.45): 0.30  Quantitative Gated Spect Images  QGS EDV: 110 ml  QGS ESV: 50 ml  Impression  Exercise Capacity: Good exercise capacity.  BP Response: Normal blood pressure response.  Clinical Symptoms: No chest pain.  ECG Impression: No significant ST segment change suggestive of ischemia.  Comparison with Prior Nuclear Study: No previous nuclear study performed  Overall Impression: Normal stress nuclear study.  LV Ejection Fraction: 54%. LV Wall Motion: NL LV Function; NL Wall Motion  EKG:  EKG is not ordered today. The ekg ordered today demonstrates   Recent Labs: No results  found for requested labs within last 365 days.   Lipid Panel No results found for: CHOL, TRIG, HDL, CHOLHDL, VLDL, LDLCALC, LDLDIRECT   Wt Readings from Last 3 Encounters:  11/23/14 200 lb 12.8 oz (91.082 kg)  05/28/14 195 lb (88.451 kg)  05/14/14 195 lb (88.451 kg)     Other studies Reviewed: Additional studies/ records that were reviewed today include: . Review of the above records demonstrates:    Assessment and Plan:   1. Aortic valve disease: S/p repair only of bicuspid aortic valve for severe AI at [facility] in 2012. Echo 05/23/12 with mild aortic valvular disease (mild AI, mild AS).  Will repeat echo now.   2. CAD: He is known to have mild CAD by cath 2012. Stress test February 2015 without ischemia. No further ischemic workup at this time.   3. Palpitations: Likely due to PACs.  Continue beta blocker.   Current medicines are reviewed at length with the patient today.  The patient does not have concerns regarding medicines.  The following changes have been made:  no change  Labs/ tests ordered today include:   Orders Placed This Encounter  Procedures  . EKG 12-Lead  . Echocardiogram    Disposition:   FU with me in 12  months  Signed, Lauree Chandler, MD 11/24/2014 8:56 AM    [facility] [place], [place], [place]  [address] Phone: [phone]; Fax: [phone]

## 2014-11-23 NOTE — Patient Instructions (Signed)
Medication Instructions:  Your physician recommends that you continue on your current medications as directed. Please refer to the Current Medication list given to you today.   Labwork: none  Testing/Procedures: Your physician has requested that you have an echocardiogram. Echocardiography is a painless test that uses sound waves to create images of your heart. It provides your doctor with information about the size and shape of your heart and how well your heart's chambers and valves are working. This procedure takes approximately one hour. There are no restrictions for this procedure.    Follow-Up: Your physician wants you to follow-up in: 12 months.  You will receive a reminder letter in the mail two months in advance. If you don't receive a letter, please call our office to schedule the follow-up appointment.   Any Other Special Instructions Will Be Listed Below (If Applicable).

## 2014-11-26 ENCOUNTER — Other Ambulatory Visit: Payer: Self-pay

## 2014-11-26 ENCOUNTER — Ambulatory Visit (HOSPITAL_COMMUNITY): Payer: BC Managed Care – PPO | Attending: Cardiology

## 2014-11-26 DIAGNOSIS — I352 Nonrheumatic aortic (valve) stenosis with insufficiency: Secondary | ICD-10-CM | POA: Insufficient documentation

## 2014-11-26 DIAGNOSIS — I358 Other nonrheumatic aortic valve disorders: Secondary | ICD-10-CM | POA: Diagnosis present

## 2014-11-26 DIAGNOSIS — E785 Hyperlipidemia, unspecified: Secondary | ICD-10-CM | POA: Insufficient documentation

## 2014-11-26 DIAGNOSIS — I5189 Other ill-defined heart diseases: Secondary | ICD-10-CM | POA: Diagnosis not present

## 2014-11-26 DIAGNOSIS — I359 Nonrheumatic aortic valve disorder, unspecified: Secondary | ICD-10-CM

## 2014-11-26 DIAGNOSIS — I517 Cardiomegaly: Secondary | ICD-10-CM | POA: Diagnosis not present

## 2014-12-18 ENCOUNTER — Other Ambulatory Visit: Payer: Self-pay | Admitting: Cardiovascular Disease

## 2015-04-04 HISTORY — PX: CERVICAL FUSION: SHX112

## 2015-07-09 ENCOUNTER — Encounter: Payer: Self-pay | Admitting: Cardiology

## 2015-07-09 ENCOUNTER — Ambulatory Visit (INDEPENDENT_AMBULATORY_CARE_PROVIDER_SITE_OTHER): Payer: BC Managed Care – PPO | Admitting: Cardiology

## 2015-07-09 VITALS — BP 138/82 | HR 73 | Ht 66.0 in | Wt 205.0 lb

## 2015-07-09 DIAGNOSIS — Z01818 Encounter for other preprocedural examination: Secondary | ICD-10-CM

## 2015-07-09 NOTE — Progress Notes (Signed)
07/09/2015 Jorge Graves   1955-09-15  VO:[id number]  Primary Physician PERRY,LAWRENCE Percell Miller, MD Primary Cardiologist: Dr. Angelena Form   Reason for Visit/CC: Surgical Clearance  HPI:  60 yo WM, followed by Dr. Angelena Form, presenting to clinic for surgical clearance. He is tentatively scheduled to undergo back surgery (Cervical fusion) at [facility].   He has a history of bicuspid aortic valve s/p aortic valve repair at [facility] 2012. He had a history of moderate to severe aortic insufficiency in the setting of bicuspid aortic valve. 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 Dr. Fletcher Anon suggested pursuing the possibility of aortic valve repair instead of replacement. The patient requested to be seen at [facility]. He underwent cardiac catheterization in 2012 which showed only a 30% stenosis in the right coronary artery. The patient underwent aortic valve repair without complications on [id number].   He was seen in the office by Dr. Angelena Form 05/08/12 and he had c/o constant chest pressure for 3-4 months and palpitations at night. He arranged an echo, 48 hour holter monitor and exercise stress myoview. Echo showed LVH with normal LV function, mild AI and mild AS but overall well functioning aortic valve s/p repair. Exercise stress myoview on 05/14/12 with good exercise tolerance, no chest pain and no evidence of ischemia with normal LV function noted. 48 hour holter monitor with PACs, NSR.   His most recent 2D echo was 11/2014. This showed normal EF of 55-60% and normal wall motion. Only mild AI was noted.    Today in clinic, he reports that he has done well sine his last OV. He denies any CP or dyspnea. No syncope/ near syncope, palpitations, orthopnea, PND or LEE. He denies any exertional symptoms or limitations. He performs yard work, chops trees without chest pain or dyspnea. His EKG today shows NSR.    Current Outpatient Prescriptions  Medication Sig Dispense Refill    . aspirin 81 MG tablet Take 81 mg by mouth daily.      . Coenzyme Q10 (COQ10 PO) Take 1 capsule by mouth daily.    . metoprolol tartrate (LOPRESSOR) 25 MG tablet TAKE 1 TABLET BY MOUTH TWICE DAILY 60 tablet 10  . Multiple Vitamin (MULTIVITAMIN) tablet Take 1 tablet by mouth daily.    . vitamin B-12 (CYANOCOBALAMIN) 100 MCG tablet Take 250 mcg by mouth daily. As needed/occasional     No current facility-administered medications for this visit.    Allergies  Allergen Reactions  . Latex Rash    Social History   Social History  . Marital Status: Married    Spouse Name: N/A  . Number of Children: 7  . 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 Smoking status: Never Smoker   . Smokeless tobacco: Never Used  . Alcohol Use: No  . Drug Use: Yes    Special: Marijuana     Comment: last used over 20 years ago  . Sexual Activity: Not on file   Other Topics Concern  . Not on file   Social History Narrative     Review of Systems: General: negative for chills, fever, night sweats or weight changes.  Cardiovascular: negative for chest pain, dyspnea on exertion, edema, orthopnea, palpitations, paroxysmal nocturnal dyspnea or shortness of breath Dermatological: negative for rash Respiratory: negative for cough or wheezing Urologic: negative for hematuria Abdominal: negative for nausea, vomiting, diarrhea, bright red blood per  rectum, melena, or hematemesis Neurologic: negative for visual changes, syncope, or dizziness All other systems reviewed and are otherwise negative except as noted above.    Blood pressure 138/82, pulse 73, height 5\' 6"  (1.676 m), weight 205 lb (92.987 kg).  General appearance: alert, cooperative and no distress Neck: no carotid bruit and no JVD Lungs: clear to auscultation bilaterally Heart: regular rate and rhythm and 1/6 AI murmur Extremities: no LEE Pulses: 2+ and symmetric Skin: warm and  dry Neurologic: Grossly normal  EKG NSR. 73 bpm. No ischemia.   ASSESSMENT AND PLAN:   1. H/o Severe AI Secondary to Bicuspid Aortic Valve: s/p AV repair at [facility] in 2012. Recent 2D echo 11/2014 showed mild AI with normal EF. Soft 1/6 murmur noted on exam today. He denies any CP, dyspnea, orthopnea, PND, LEE. No exertional symptoms.   2. Nonobstructive CAD: LHC in 2012 showed only mild 30% disease in the RCA. Stress test in 2014 was negative for ischemia. Echo 11/2014 showed normal EF and wall motion. No exertional chest pain or dyspnea. Normal EKG. No indication for preoperative ischemic testing.   3. Surgical Clearance: patient with no anginal symptomology, including no exertional chest pain or dyspnea. He is able to engage in more that 4 METS of physical activity w/o limitation. EKG shows NSR. Physical exam is benign except for mild AI murmur. He is low risk for non cardiac surgery. We will fax clearance letter to his surgeon.   PLAN  Keep yearly f/u with Dr. Angelena Form in August.   Lyda Jester PA-C 07/09/2015 8:33 AM

## 2015-07-09 NOTE — Patient Instructions (Addendum)
Medication Instructions:  Your physician recommends that you continue on your current medications as directed. Please refer to the Current Medication list given to you today.   Labwork: None ordered  Testing/Procedures: None ordered  Follow-Up: Your physician wants you to follow-up in: 4 MONTHS WITH DR. Angelena Form   You will receive a reminder letter in the mail two months in advance. If you don't receive a letter, please call our office to schedule the follow-up appointment.    Any Other Special Instructions Will Be Listed Below (If Applicable).     If you need a refill on your cardiac medications before your next appointment, please call your pharmacy.

## 2015-11-15 ENCOUNTER — Other Ambulatory Visit: Payer: Self-pay | Admitting: Cardiovascular Disease

## 2015-11-19 ENCOUNTER — Other Ambulatory Visit: Payer: Self-pay | Admitting: *Deleted

## 2015-11-19 ENCOUNTER — Other Ambulatory Visit: Payer: Self-pay | Admitting: Cardiovascular Disease

## 2015-11-19 MED ORDER — METOPROLOL TARTRATE 25 MG PO TABS: 25.0000 mg | ORAL_TABLET | Freq: Two times a day (BID) | ORAL | 7 refills | Status: DC

## 2015-12-18 ENCOUNTER — Other Ambulatory Visit: Payer: Self-pay | Admitting: Cardiovascular Disease

## 2016-01-04 NOTE — Progress Notes (Signed)
Chief Complaint  Patient presents with  . aortic insufficiency     History of Present Illness: 60 yo WM with history of bicuspid aortic valve s/p aortic valve repair at [facility] 2012 who is here today for follow up. He had a history of moderate to severe aortic insufficiency in the setting of bicuspid aortic valve. 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 Dr. Fletcher Anon suggested pursuing the possibility of aortic valve repair instead of replacement. The patient requested to be seen at [facility]. He underwent cardiac catheterization 2012 which showed only a 30% stenosis in the right coronary artery. The patient underwent aortic valve repair without complications on [id number]. I saw him in the office 05/08/12 and he had c/o constant chest pressure for 3-4 months and palpitations at night. I arranged an echo, 48 hour holter monitor and exercise stress myoview. Echo showed LVH with normal LV function, mild AI and mild AS but overall well functioning aortic valve s/p repair. Exercise stress myoview on 05/14/12 with good exercise tolerance, no chest pain and no evidence of ischemia with normal LV function noted. 48 hour holter monitor with PACs, NSR. He has not tolerated statins. Echo 11/26/14 with normal LV function, mild AS, mild AI.   He is here today for follow up. He has no chest pain or SOB. He is very active. No near syncope or syncope. Dyspnea is improved. No lower ext edema.   Primary Care Physician: Lillard Anes, MD   Past Medical History:  Diagnosis Date  . Aortic insufficiency    moderate to severe  . Bicuspid aortic valve   . Coronary artery disease    30% in RCA  . Hyperlipidemia     Past Surgical History:  Procedure Laterality Date  . AORTIC VALVE SURGERY  10/2010   valve repair without replacement at [facility].   Marland Kitchen CARDIAC CATHETERIZATION  10/2010   30% in RCA  . SUPERFICIAL LYMPH NODE BIOPSY / EXCISION     under arm    Current  Outpatient Prescriptions  Medication Sig Dispense Refill  . aspirin 81 MG tablet Take 81 mg by mouth daily.      . Coenzyme Q10 (COQ10 PO) Take 1 capsule by mouth daily.    . metoprolol tartrate (LOPRESSOR) 25 MG tablet Take 1 tablet (25 mg total) by mouth 2 (two) times daily. 60 tablet 7  . Multiple Vitamin (MULTIVITAMIN) tablet Take 1 tablet by mouth daily.    . vitamin B-12 (CYANOCOBALAMIN) 100 MCG tablet Take 250 mcg by mouth daily. As needed/occasional     No current facility-administered medications for this visit.     Allergies  Allergen Reactions  . Latex Rash    Social History   Social History  . Marital status: Married    Spouse name: N/A  . Number of children: 7  . Years of education: N/A   Occupational History  . 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 Smoking status: Never Smoker  . Smokeless tobacco: Never Used  . Alcohol use No  . Drug use:     Types: Marijuana     Comment: last used over 20 years ago  . Sexual activity: Not on file   Other Topics Concern  . Not on file   Social History Narrative  . No narrative on file    Family History  Problem Relation Age of Onset  . Hypertension    . Hyperlipidemia    .  Heart attack Mother   . Cancer Mother     Breast cancer  . Cancer Father     Brain tumor  . Colon cancer Maternal Grandfather     Review of Systems:  As stated in the HPI and otherwise negative.   BP (!) 150/90   Pulse 68   Ht 5\' 7"  (1.702 m)   Wt 190 lb 1.9 oz (86.2 kg)   BMI 29.78 kg/m   Physical Examination: General: Well developed, well nourished, NAD  HEENT: OP clear, mucus membranes moist  SKIN: warm, dry. No rashes. Neuro: No focal deficits  Musculoskeletal: Muscle strength 5/5 all ext  Psychiatric: Mood and affect normal  Neck: No JVD, no carotid bruits, no thyromegaly, no lymphadenopathy.  Lungs:Clear bilaterally, no wheezes, rhonci, crackles Cardiovascular: Regular rate and rhythm. Soft systolic murmur. No  gallops or rubs. Abdomen:Soft. Bowel sounds present. Non-tender.  Extremities: No lower extremity edema. Pulses are 2 + in the bilateral DP/PT.  Echo 11/26/14: - Left ventricle: The cavity size was normal. Wall thickness was   normal. Systolic function was normal. The estimated ejection   fraction was in the range of 55% to 60%. Wall motion was normal;   there were no regional wall motion abnormalities. Doppler   parameters are consistent with abnormal left ventricular   relaxation (grade 1 diastolic dysfunction). - Aortic valve: There was mild stenosis. There was mild   regurgitation. - Left atrium: The atrium was mildly dilated. Impressions: - Normal LV function; grade 1 diastolic dysfunction; mild LAE;   thickened aortic valve with mild AS (mean gradient 17 mmHg); mild   AI.  Stress myoview 05/14/12: Stress Procedure: The patient exercised on the treadmill utilizing the Bruce Protocol for 8:30 minutes. The patient stopped due to fatigue and denied any chest pain. Technetium 70m Sestamibi was injected at peak exercise and myocardial perfusion imaging was performed after a brief delay.  Stress ECG: No significant ST segment change suggestive of ischemia.  QPS  Raw Data Images: Normal; no motion artifact; normal heart/lung ratio.  Stress Images: Normal homogeneous uptake in all areas of the myocardium.  Rest Images: Normal homogeneous uptake in all areas of the myocardium.  Subtraction (SDS): No evidence of ischemia.  Transient Ischemic Dilatation (Normal <1.22): 0.91  Lung/Heart Ratio (Normal <0.45): 0.30  Quantitative Gated Spect Images  QGS EDV: 110 ml  QGS ESV: 50 ml  Impression  Exercise Capacity: Good exercise capacity.  BP Response: Normal blood pressure response.  Clinical Symptoms: No chest pain.  ECG Impression: No significant ST segment change suggestive of ischemia.  Comparison with Prior Nuclear Study: No previous nuclear study performed  Overall Impression: Normal  stress nuclear study.  LV Ejection Fraction: 54%. LV Wall Motion: NL LV Function; NL Wall Motion  EKG:  EKG is ordered today. The ekg ordered today demonstrates   Recent Labs: No results found for requested labs within last 8760 hours.   Lipid Panel No results found for: CHOL, TRIG, HDL, CHOLHDL, VLDL, LDLCALC, LDLDIRECT   Wt Readings from Last 3 Encounters:  01/05/16 190 lb 1.9 oz (86.2 kg)  07/09/15 205 lb (93 kg)  11/23/14 200 lb 12.8 oz (91.1 kg)     Other studies Reviewed: Additional studies/ records that were reviewed today include: . Review of the above records demonstrates:    Assessment and Plan:   1. Aortic valve disease: S/p repair only of bicuspid aortic valve for severe AI at [facility] in 2012. Echo August 2016 with mild aortic  valvular disease (mild AI, mild AS).    2. CAD: He is known to have mild CAD by cath 2012. Stress test February 2015 without ischemia.   3. Palpitations: Likely due to PACs.  Continue beta blocker.   4. Carotid bruit, left: Will arrange carotid artery dopplers to assess.   Current medicines are reviewed at length with the patient today.  The patient does not have concerns regarding medicines.  The following changes have been made:  no change  Labs/ tests ordered today include:   No orders of the defined types were placed in this encounter.   Disposition:   FU with me in 12  months  Signed, Lauree Chandler, MD 01/05/2016 1:29 PM    [facility] [place], [place], [place]  [address] Phone: [phone]; Fax: [phone]

## 2016-01-05 ENCOUNTER — Encounter: Payer: Self-pay | Admitting: Cardiovascular Disease

## 2016-01-05 ENCOUNTER — Encounter (INDEPENDENT_AMBULATORY_CARE_PROVIDER_SITE_OTHER): Payer: Self-pay

## 2016-01-05 ENCOUNTER — Ambulatory Visit (INDEPENDENT_AMBULATORY_CARE_PROVIDER_SITE_OTHER): Payer: BC Managed Care – PPO | Admitting: Cardiovascular Disease

## 2016-01-05 VITALS — BP 150/90 | HR 68 | Ht 67.0 in | Wt 190.1 lb

## 2016-01-05 DIAGNOSIS — R002 Palpitations: Secondary | ICD-10-CM

## 2016-01-05 DIAGNOSIS — R0989 Other specified symptoms and signs involving the circulatory and respiratory systems: Secondary | ICD-10-CM

## 2016-01-05 DIAGNOSIS — I2510 Atherosclerotic heart disease of native coronary artery without angina pectoris: Secondary | ICD-10-CM | POA: Diagnosis not present

## 2016-01-05 DIAGNOSIS — I359 Nonrheumatic aortic valve disorder, unspecified: Secondary | ICD-10-CM | POA: Diagnosis not present

## 2016-01-05 NOTE — Patient Instructions (Signed)
Medication Instructions:  Your physician recommends that you continue on your current medications as directed. Please refer to the Current Medication list given to you today.   Labwork: none  Testing/Procedures: Your physician has requested that you have a carotid duplex. This test is an ultrasound of the carotid arteries in your neck. It looks at blood flow through these arteries that supply the brain with blood. Allow one hour for this exam. There are no restrictions or special instructions.    Follow-Up: Your physician wants you to follow-up in: 12 months.  You will receive a reminder letter in the mail two months in advance. If you don't receive a letter, please call our office to schedule the follow-up appointment.   Any Other Special Instructions Will Be Listed Below (If Applicable).     If you need a refill on your cardiac medications before your next appointment, please call your pharmacy.

## 2016-01-06 ENCOUNTER — Ambulatory Visit (HOSPITAL_COMMUNITY)
Admission: RE | Admit: 2016-01-06 | Discharge: 2016-01-06 | Disposition: A | Discharge status: Home / Self Care | Payer: BC Managed Care – PPO | Source: Ambulatory Visit | Attending: Internal Medicine | Admitting: Internal Medicine

## 2016-01-06 DIAGNOSIS — I2510 Atherosclerotic heart disease of native coronary artery without angina pectoris: Secondary | ICD-10-CM | POA: Insufficient documentation

## 2016-01-06 DIAGNOSIS — R0989 Other specified symptoms and signs involving the circulatory and respiratory systems: Secondary | ICD-10-CM | POA: Insufficient documentation

## 2016-01-06 DIAGNOSIS — E785 Hyperlipidemia, unspecified: Secondary | ICD-10-CM | POA: Insufficient documentation

## 2016-08-10 ENCOUNTER — Other Ambulatory Visit: Payer: Self-pay | Admitting: Cardiovascular Disease

## 2017-01-09 ENCOUNTER — Other Ambulatory Visit: Payer: Self-pay | Admitting: Cardiovascular Disease

## 2017-01-23 ENCOUNTER — Encounter: Payer: Self-pay | Admitting: Cardiology

## 2017-02-08 ENCOUNTER — Encounter: Payer: Self-pay | Admitting: Cardiology

## 2017-02-08 ENCOUNTER — Ambulatory Visit: Payer: BC Managed Care – PPO | Admitting: Cardiology

## 2017-02-08 VITALS — BP 136/80 | HR 66 | Ht 67.0 in | Wt 210.1 lb

## 2017-02-08 DIAGNOSIS — E785 Hyperlipidemia, unspecified: Secondary | ICD-10-CM | POA: Diagnosis not present

## 2017-02-08 DIAGNOSIS — I2510 Atherosclerotic heart disease of native coronary artery without angina pectoris: Secondary | ICD-10-CM

## 2017-02-08 LAB — HEPATIC FUNCTION PANEL
ALK PHOS: 49 IU/L (ref 39–117)
ALT: 27 IU/L (ref 0–44)
AST: 28 IU/L (ref 0–40)
Albumin: 4.7 g/dL (ref 3.6–4.8)
BILIRUBIN TOTAL: 0.6 mg/dL (ref 0.0–1.2)
BILIRUBIN, DIRECT: 0.12 mg/dL (ref 0.00–0.40)
Total Protein: 7.2 g/dL (ref 6.0–8.5)

## 2017-02-08 LAB — LIPID PANEL
CHOL/HDL RATIO: 6.6 ratio — AB (ref 0.0–5.0)
CHOLESTEROL TOTAL: 265 mg/dL — AB (ref 100–199)
HDL: 40 mg/dL (ref >39)
LDL Calculated: 195 mg/dL — ABNORMAL HIGH (ref 0–99)
TRIGLYCERIDES: 148 mg/dL (ref 0–149)
VLDL CHOLESTEROL CAL: 30 mg/dL (ref 5–40)

## 2017-02-08 MED ORDER — METOPROLOL TARTRATE 25 MG PO TABS: 25.0000 mg | ORAL_TABLET | Freq: Two times a day (BID) | ORAL | 3 refills | Status: DC

## 2017-02-08 NOTE — Progress Notes (Signed)
02/08/2017 Jorge Graves   1955-05-06  [id number]  Primary Physician Lillard Anes, MD Primary Cardiologist: Dr. Angelena Form   Reason for Visit/CC: 1 year f/u for Aortic Valve Disease  HPI: Jorge Graves is a 61 y/o male, followed by Dr. Angelena Form, who presents to clinic today for his yearly f/u for aortic valve disease and mild CAD.   He has a history of bicuspid aortic valve s/p aortic valve repair at [facility] 2012. He had a history of moderate to severe aortic insufficiency in the setting of bicuspid aortic valve. 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 Dr. Fletcher Anon suggested pursuing the possibility of aortic valve repair instead of replacement. The patient requested to be seen at [facility]. He underwent cardiac catheterization in 2012 which showed only a 30% stenosis in the right coronary artery. The patient underwent aortic valve repair without complications on 07/11/79.   He was seen in the office by Dr. Angelena Form 05/08/12 and he had c/o constant chest pressure for 3-4 months and palpitations at night. He arranged an echo, 48 hour holter monitor and exercise stress myoview. Echo showed LVH with normal LV function, mild AI and mild AS but overall well functioning aortic valve s/p repair. Exercise stress myoview on 05/14/12 with good exercise tolerance, no chest pain and no evidence of ischemia with normal LV function noted. 48 hour holter monitor with PACs, NSR.   His most recent 2D echo was 11/2014. This showed normal EF of 55-60% and normal wall motion. Only mild AI was noted.    Today in clinic, he reports he has done well. He denies CP and dyspnea. No syncope/ near syncope. His BP is well controlled with metprolol. He notes a h/o HLD but currently not on any statins. No recent FLP on file. He is fasting today. EKG shows NSR.   Current Meds  Medication Sig  . aspirin 81 MG tablet Take 81 mg by mouth daily.    . Coenzyme Q10 (COQ10 PO) Take 1  capsule by mouth daily.  . metoprolol tartrate (LOPRESSOR) 25 MG tablet Take 1 tablet (25 mg total) by mouth 2 (two) times daily. Patient needs to call and schedule an appointment for further refills 1st attempt  . Multiple Vitamin (MULTIVITAMIN) tablet Take 1 tablet by mouth daily.  . vitamin B-12 (CYANOCOBALAMIN) 100 MCG tablet Take 250 mcg by mouth daily. As needed/occasional   Allergies  Allergen Reactions  . Latex Rash   Past Medical History:  Diagnosis Date  . Aortic insufficiency    moderate to severe  . Bicuspid aortic valve   . Coronary artery disease    30% in RCA  . Hyperlipidemia    Family History  Problem Relation Age of Onset  . Hypertension Unknown   . Hyperlipidemia Unknown   . Heart attack Mother   . Cancer Mother        Breast cancer  . Cancer Father        Brain tumor  . Colon cancer Maternal Grandfather    Past Surgical History:  Procedure Laterality Date  . AORTIC VALVE SURGERY  10/2010   valve repair without replacement at [facility].   Marland Kitchen CARDIAC CATHETERIZATION  10/2010   30% in RCA  . SUPERFICIAL LYMPH NODE BIOPSY / EXCISION     under arm   Social History   Socioeconomic History  . Marital status: Married    Spouse name: Not on file  . Number of children: 7  .  Years of education: Not on file  . Highest education level: Not on file  Social Needs  . Financial resource strain: Not on file  . Food insecurity - worry: Not on file  . Food insecurity - inability: Not on file  . Transportation needs - medical: Not on file  . Transportation needs - non-medical: Not on file  Occupational History  . Occupation: Pharmacist, hospital --- [employer]  Tobacco Use  . Smoking status: Never Smoker  . Smokeless tobacco: Never Used  Substance and Sexual Activity  . Alcohol use: No  . Drug use: Yes    Types: Marijuana    Comment: last used over 20 years ago  . Sexual activity: Not on file  Other Topics Concern  . Not on file  Social History Narrative  . Not on file      Review of Systems: General: negative for chills, fever, night sweats or weight changes.  Cardiovascular: negative for chest pain, dyspnea on exertion, edema, orthopnea, palpitations, paroxysmal nocturnal dyspnea or shortness of breath Dermatological: negative for rash Respiratory: negative for cough or wheezing Urologic: negative for hematuria Abdominal: negative for nausea, vomiting, diarrhea, bright red blood per rectum, melena, or hematemesis Neurologic: negative for visual changes, syncope, or dizziness All other systems reviewed and are otherwise negative except as noted above.   Physical Exam:  Blood pressure 136/80, pulse 66, height 5\' 7"  (1.702 m), weight 210 lb 1.9 oz (95.3 kg), SpO2 98 %.  General appearance: alert, cooperative and no distress Neck: no carotid bruit and no JVD Lungs: clear to auscultation bilaterally Heart: regular rate and rhythm, S1, S2 normal, no murmur, click, rub or gallop Extremities: extremities normal, atraumatic, no cyanosis or edema Pulses: 2+ and symmetric Skin: Skin color, texture, turgor normal. No rashes or lesions Neurologic: Grossly normal  EKG NSR 67 bpm -- personally reviewed   ASSESSMENT AND PLAN:   1. Aortic Valve Disease: history of bicuspid aortic valve s/p aortic valve repair at [facility] 2012. Stable w/o CP or dyspnea. No exertional symptoms. No murmur appreciated on exam today. Last echo was in 2016 and noted mild AS and mild AI. Given he has a bicuspid valve, I recommended f/u 2D echo for surveillance, however pt resistant to the idea of getting an echo at this time. Will re discuss next year.   2. CAD: mild CAD noted on cath in 2012. NST in 2015 was negative for ischemia. He denies any anginal symptoms. Continue ASA and BB. We will check FLP today to check LDL. If not at goal, will consider addition of a statin.   3. HTN: controlled on current regimen. HR stable. Refill metoprolol.   4. HLD: pt notes long h/o  high cholesterol but not on any meds. Pt was on statin previously but self discontinued due to side effects. He has h/o nonobstructive CAD. We will check FLP today.  If not at goal, will consider retrial of statin. If severely elevated, may also consider PCSK9 inhibitor.    Follow-Up in 1 year w/ Dr. Angelena Form.   Brittainy Ladoris Gene, MHS [facility] 02/08/2017 8:39 AM

## 2017-02-08 NOTE — Patient Instructions (Signed)
Your physician recommends that you continue on your current medications as directed. Please refer to the Current Medication list given to you today.  Your physician recommends that you return for lab work in: Today lipid and liver  Your physician wants you to follow-up in: [place] will receive a reminder letter in the mail two months in advance. If you don't receive a letter, please call our office to schedule the follow-up appointment.

## 2017-08-10 ENCOUNTER — Other Ambulatory Visit: Payer: Self-pay

## 2017-08-10 MED ORDER — METOPROLOL TARTRATE 25 MG PO TABS: 25.0000 mg | ORAL_TABLET | Freq: Two times a day (BID) | ORAL | 0 refills | Status: DC

## 2017-11-12 ENCOUNTER — Other Ambulatory Visit: Payer: Self-pay | Admitting: Cardiology

## 2018-01-22 ENCOUNTER — Encounter: Payer: Self-pay | Admitting: Cardiovascular Disease

## 2018-02-11 ENCOUNTER — Ambulatory Visit: Payer: BC Managed Care – PPO | Admitting: Cardiovascular Disease

## 2018-11-20 ENCOUNTER — Encounter: Payer: Self-pay | Admitting: Gastroenterology

## 2018-11-26 ENCOUNTER — Ambulatory Visit: Payer: BC Managed Care – PPO | Admitting: Gastroenterology
